# Patient Record
Sex: Male | Born: 1950 | Race: White | Hispanic: No | Marital: Married | Smoking: Never smoker
Health system: Southern US, Community
[De-identification: ages and names within clinical notes are randomized; demographics above are authoritative.]

## PROBLEM LIST (undated history)

## (undated) DIAGNOSIS — Z972 Presence of dental prosthetic device (complete) (partial): Secondary | ICD-10-CM

## (undated) DIAGNOSIS — T8859XA Other complications of anesthesia, initial encounter: Secondary | ICD-10-CM

## (undated) DIAGNOSIS — K8020 Calculus of gallbladder without cholecystitis without obstruction: Secondary | ICD-10-CM

## (undated) DIAGNOSIS — M1990 Unspecified osteoarthritis, unspecified site: Secondary | ICD-10-CM

## (undated) DIAGNOSIS — T4145XA Adverse effect of unspecified anesthetic, initial encounter: Secondary | ICD-10-CM

## (undated) DIAGNOSIS — K8050 Calculus of bile duct without cholangitis or cholecystitis without obstruction: Secondary | ICD-10-CM

## (undated) DIAGNOSIS — Z8619 Personal history of other infectious and parasitic diseases: Secondary | ICD-10-CM

## (undated) DIAGNOSIS — K219 Gastro-esophageal reflux disease without esophagitis: Secondary | ICD-10-CM

## (undated) HISTORY — DX: Calculus of gallbladder without cholecystitis without obstruction: K80.20

## (undated) HISTORY — DX: Calculus of bile duct without cholangitis or cholecystitis without obstruction: K80.50

## (undated) HISTORY — PX: COLONOSCOPY: SHX174

## (undated) HISTORY — DX: Personal history of other infectious and parasitic diseases: Z86.19

## (undated) HISTORY — PX: LAPAROSCOPIC PARTIAL COLECTOMY: SHX5907

---

## 2007-01-29 ENCOUNTER — Ambulatory Visit: Payer: Self-pay | Admitting: Gastroenterology

## 2007-11-07 DIAGNOSIS — M129 Arthropathy, unspecified: Secondary | ICD-10-CM | POA: Insufficient documentation

## 2013-07-18 LAB — BASIC METABOLIC PANEL
BUN: 12 mg/dL (ref 4–21)
Creatinine: 0.9 mg/dL (ref 0.6–1.3)
GLUCOSE: 95 mg/dL
Potassium: 5 mmol/L (ref 3.4–5.3)
Sodium: 143 mmol/L (ref 137–147)

## 2013-07-18 LAB — LIPID PANEL
CHOLESTEROL: 175 mg/dL (ref 0–200)
HDL: 55 mg/dL (ref 35–70)
LDL CALC: 102 mg/dL
Triglycerides: 92 mg/dL (ref 40–160)

## 2013-07-18 LAB — PSA: PSA: 0.5

## 2013-07-18 LAB — TSH: TSH: 0.95 u[IU]/mL (ref 0.41–5.90)

## 2015-07-09 ENCOUNTER — Ambulatory Visit: Payer: Self-pay | Admitting: Physician Assistant

## 2015-07-09 DIAGNOSIS — Z299 Encounter for prophylactic measures, unspecified: Secondary | ICD-10-CM

## 2015-07-09 NOTE — Progress Notes (Signed)
Patient ID: Andrew Villanueva, male   DOB: [birth year], 64 y.o.   MRN: [id number] Patient came in to have flu shot done here in the clinic.  Patient stayed for 10 minutes and had no adverse reaction.

## 2015-11-02 DIAGNOSIS — E669 Obesity, unspecified: Secondary | ICD-10-CM | POA: Insufficient documentation

## 2015-11-02 DIAGNOSIS — R030 Elevated blood-pressure reading, without diagnosis of hypertension: Secondary | ICD-10-CM

## 2015-11-02 DIAGNOSIS — IMO0001 Reserved for inherently not codable concepts without codable children: Secondary | ICD-10-CM | POA: Insufficient documentation

## 2015-11-04 ENCOUNTER — Ambulatory Visit (INDEPENDENT_AMBULATORY_CARE_PROVIDER_SITE_OTHER): Payer: Managed Care, Other (non HMO) | Admitting: Family Medicine

## 2015-11-04 ENCOUNTER — Encounter: Payer: Self-pay | Admitting: Family Medicine

## 2015-11-04 VITALS — BP 140/80 | HR 56 | Temp 98.0°F | Resp 16 | Ht 73.0 in | Wt 232.0 lb

## 2015-11-04 DIAGNOSIS — Z0000 Encounter for general adult medical examination without abnormal findings: Secondary | ICD-10-CM | POA: Diagnosis not present

## 2015-11-04 NOTE — Patient Instructions (Signed)
   It is recommended to engage in 150 minutes of vigorous exercise every week.    Recommend taking 81mg  enteric coated aspirin to reduce risk of vascular events such as heart attacks and strokes.

## 2015-11-04 NOTE — Progress Notes (Signed)
Patient: Andrew Villanueva, Male    DOB: 1950-09-20, 65 y.o.   MRN: [id number] Visit Date: 11/04/2015  Today's Provider: Lelon Huh, MD   Chief Complaint  Patient presents with  . Annual Exam  . Blood Pressure Check    follow up   Subjective:    Annual physical exam Andrew Villanueva is a 65 y.o. male who presents today for health maintenance and complete physical. He feels well. He reports never exercising . He reports he is sleeping fairly well.  -----------------------------------------------------------------  Elevated blood pressure, follow-up:  BP Readings from Last 3 Encounters:  07/18/13 148/80    He was last seen for hypertension 3 years ago.  BP at that visit was 148/80. Management since that visit includes recommending patient start DASH diet, increase exercise and loose weight. He reports fair compliance with treatment. Has not been exercising.  He is not having side effects.  He is not exercising. He is not adherent to low salt diet.   Outside blood pressures are checked at the pharmacy occasionally; patient reports it is usually borderline high. He is experiencing chest pain.  Patient denies chest pressure/discomfort, claudication, dyspnea, exertional chest pressure/discomfort, irregular heart beat, lower extremity edema, near-syncope, orthopnea, palpitations, paroxysmal nocturnal dyspnea, syncope and tachypnea.   Cardiovascular risk factors include advanced age (older than 51 for men, 33 for women) and male gender.  Use of agents associated with hypertension: none.     Weight trend: stable Wt Readings from Last 3 Encounters:  07/18/13 235 lb (106.595 kg)    Current diet: in general, a "healthy" diet    ------------------------------------------------------------------------    Review of Systems  Constitutional: Positive for fatigue. Negative for fever, chills and appetite change.  HENT: Negative for congestion, ear pain, hearing loss,  nosebleeds and trouble swallowing.   Eyes: Negative for pain and visual disturbance.  Respiratory: Negative for cough, chest tightness and shortness of breath.   Cardiovascular: Positive for chest pain (unchanged from baseline ). Negative for palpitations and leg swelling.  Gastrointestinal: Negative for nausea, vomiting, abdominal pain, diarrhea, constipation and blood in stool.  Endocrine: Negative for polydipsia, polyphagia and polyuria.  Genitourinary: Negative for dysuria and flank pain.  Musculoskeletal: Negative for myalgias, back pain, joint swelling, arthralgias and neck stiffness.  Skin: Negative for color change, rash and wound.  Neurological: Negative for dizziness, tremors, seizures, speech difficulty, weakness, light-headedness and headaches.  Psychiatric/Behavioral: Negative for behavioral problems, confusion, sleep disturbance, dysphoric mood and decreased concentration. The patient is not nervous/anxious.   All other systems reviewed and are negative.   Social History      He  reports that he has been smoking.  He does not have any smokeless tobacco history on file. He reports that he drinks alcohol. He reports that he does not use illicit drugs.       Social History   Social History  . Marital Status: Married    Spouse Name: N/A  . Number of Children: 2  . Years of Education: N/A   Occupational History  . Associate Professor    Social History Main Topics  . Smoking status: Never Smoker   . Smokeless tobacco: None  . Alcohol Use: 0.0 oz/week    0 Standard drinks or equivalent per week     Comment: minimal alcohol consumption  . Drug Use: No  . Sexual Activity: Not Asked   Other Topics Concern  . None   Social History Narrative  Past Medical History  Diagnosis Date  . History of measles   . Arthropathy   . Elevated blood pressure   . Obesity      Patient Active Problem List   Diagnosis Date Noted  . Obesity 11/02/2015  . Elevated blood pressure  11/02/2015  . 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 Family history of cardiovascular disease 10/25/2006    No past surgical history on file.  Family History        Family Status  Relation Status Death Age  . Mother Deceased 81  . Father Deceased 57    natural causes  . Brother Alive   . Daughter Alive   . Son Alive   . Brother Alive         His family history includes CAD in his father; Diabetes in his mother; Heart attack in his mother. There is no history of Colon cancer or Prostate cancer.    No Known Allergies  Previous Medications   No medications on file    Patient Care Team: Birdie Sons, MD as PCP - General (Family Medicine)     Objective:   Vitals: BP 140/80 mmHg  Pulse 56  Temp(Src) 98 F (36.7 C) (Oral)  Resp 16  Ht 6\' 1"  (1.854 m)  Wt 232 lb (105.235 kg)  BMI 30.62 kg/m2  SpO2 97%   Physical Exam   General Appearance:    Alert, cooperative, no distress, appears stated age, overweigh  Head:    Normocephalic, without obvious abnormality, atraumatic  Eyes:    PERRL, conjunctiva/corneas clear, EOM's intact, fundi    benign, both eyes       Ears:    Normal TM's and external ear canals, both ears  Nose:   Nares normal, septum midline, mucosa normal, no drainage   or sinus tenderness  Throat:   Lips, mucosa, and tongue normal; teeth and gums normal  Neck:   Supple, symmetrical, trachea midline, no adenopathy;       thyroid:  No enlargement/tenderness/nodules; no carotid   bruit or JVD  Back:     Symmetric, no curvature, ROM normal, no CVA tenderness  Lungs:     Clear to auscultation bilaterally, respirations unlabored  Chest wall:    No tenderness or deformity  Heart:    Regular rate and rhythm, S1 and S2 normal, no murmur, rub   or gallop  Abdomen:     Soft, non-tender, bowel sounds active all four quadrants,    no masses, no organomegaly  Genitalia:    deferred  Rectal:    deferred  Extremities:   Extremities normal, atraumatic, no cyanosis or edema    Pulses:   2+ and symmetric all extremities  Skin:   Skin color, texture, turgor normal, no rashes or lesions  Lymph nodes:   Cervical, supraclavicular, and axillary nodes normal  Neurologic:   CNII-XII intact. Normal strength, sensation and reflexes      throughout   Depression Screen PHQ 2/9 Scores 11/04/2015  PHQ - 2 Score 0  PHQ- 9 Score 1      Assessment & Plan:     Routine Health Maintenance and Physical Exam  Exercise Activities and Dietary recommendations Goals    None      Immunization History  Administered Date(s) Administered  . Influenza,inj,Quad PF,36+ Mos 07/09/2015  . Tdap 10/25/2006    Health Maintenance  Topic Date Due  . HIV Screening  07/18/1966  . COLONOSCOPY  07/18/2001  . ZOSTAVAX  07/19/2011  .  INFLUENZA VACCINE  03/08/2016  . TETANUS/TDAP  10/24/2016  . Hepatitis C Screening  Completed      Discussed health benefits of physical activity, and encouraged him to engage in regular exercise appropriate for his age and condition.    -------------------------------------------------------------------- 1. Annual physical exam Generally doing well.  - EKG 12-Lead - Comprehensive metabolic panel - Lipid panel - PSA

## 2015-11-05 ENCOUNTER — Telehealth: Payer: Self-pay

## 2015-11-05 LAB — COMPREHENSIVE METABOLIC PANEL
ALBUMIN: 4.2 g/dL (ref 3.6–4.8)
ALT: 39 IU/L (ref 0–44)
AST: 27 IU/L (ref 0–40)
Albumin/Globulin Ratio: 1.4 (ref 1.2–2.2)
Alkaline Phosphatase: 76 IU/L (ref 39–117)
BILIRUBIN TOTAL: 1.4 mg/dL — AB (ref 0.0–1.2)
BUN/Creatinine Ratio: 14 (ref 10–22)
BUN: 13 mg/dL (ref 8–27)
CALCIUM: 9.6 mg/dL (ref 8.6–10.2)
CO2: 27 mmol/L (ref 18–29)
CREATININE: 0.94 mg/dL (ref 0.76–1.27)
Chloride: 102 mmol/L (ref 96–106)
GFR calc Af Amer: 99 mL/min/{1.73_m2} (ref >59)
GFR, EST NON AFRICAN AMERICAN: 85 mL/min/{1.73_m2} (ref >59)
GLUCOSE: 104 mg/dL — AB (ref 65–99)
Globulin, Total: 3 g/dL (ref 1.5–4.5)
Potassium: 5 mmol/L (ref 3.5–5.2)
SODIUM: 142 mmol/L (ref 134–144)
TOTAL PROTEIN: 7.2 g/dL (ref 6.0–8.5)

## 2015-11-05 LAB — PSA: Prostate Specific Ag, Serum: 0.8 ng/mL (ref 0.0–4.0)

## 2015-11-05 LAB — LIPID PANEL
CHOL/HDL RATIO: 3 ratio (ref 0.0–5.0)
Cholesterol, Total: 160 mg/dL (ref 100–199)
HDL: 53 mg/dL (ref >39)
LDL CALC: 91 mg/dL (ref 0–99)
TRIGLYCERIDES: 82 mg/dL (ref 0–149)
VLDL CHOLESTEROL CAL: 16 mg/dL (ref 5–40)

## 2015-11-05 NOTE — Telephone Encounter (Signed)
Informed pt's wife as below. Beckam Abdulaziz Drozdowski, CMA  

## 2015-11-05 NOTE — Telephone Encounter (Signed)
-----   Message from Birdie Sons, MD sent at 11/05/2015  7:47 AM EDT ----- PSA, blood sugar, kidney functions, electrolytes and cholesterol are all normal. Check labs yearly.

## 2015-11-09 ENCOUNTER — Encounter: Payer: Self-pay | Admitting: Family Medicine

## 2016-11-15 ENCOUNTER — Ambulatory Visit: Payer: Self-pay | Admitting: Physician Assistant

## 2016-11-15 VITALS — BP 130/80 | HR 62 | Temp 97.9°F | Ht 73.0 in | Wt 232.0 lb

## 2016-11-15 DIAGNOSIS — Z008 Encounter for other general examination: Secondary | ICD-10-CM

## 2016-11-15 DIAGNOSIS — Z0189 Encounter for other specified special examinations: Principal | ICD-10-CM

## 2016-11-15 NOTE — Progress Notes (Signed)
Pt here for biometric, measurements performed by RMA, pt states he had a physical with his doctor

## 2016-11-16 ENCOUNTER — Encounter: Payer: Self-pay | Admitting: Physician Assistant

## 2016-11-21 ENCOUNTER — Other Ambulatory Visit: Payer: Self-pay

## 2016-11-21 ENCOUNTER — Encounter: Payer: Self-pay | Admitting: Family Medicine

## 2016-11-21 ENCOUNTER — Ambulatory Visit (INDEPENDENT_AMBULATORY_CARE_PROVIDER_SITE_OTHER): Payer: Managed Care, Other (non HMO) | Admitting: Family Medicine

## 2016-11-21 VITALS — BP 138/80 | HR 60 | Temp 98.1°F | Resp 16 | Ht 73.0 in | Wt 231.0 lb

## 2016-11-21 DIAGNOSIS — Z1211 Encounter for screening for malignant neoplasm of colon: Secondary | ICD-10-CM | POA: Diagnosis not present

## 2016-11-21 DIAGNOSIS — Z23 Encounter for immunization: Secondary | ICD-10-CM

## 2016-11-21 DIAGNOSIS — Z0000 Encounter for general adult medical examination without abnormal findings: Secondary | ICD-10-CM | POA: Diagnosis not present

## 2016-11-21 DIAGNOSIS — Z125 Encounter for screening for malignant neoplasm of prostate: Secondary | ICD-10-CM

## 2016-11-21 DIAGNOSIS — Z299 Encounter for prophylactic measures, unspecified: Secondary | ICD-10-CM

## 2016-11-21 NOTE — Progress Notes (Signed)
Patient: Andrew Villanueva, Male    DOB: [birth year], 66 y.o.   MRN: [id number] Visit Date: 11/21/2016  Today's Provider: Lelon Huh, MD   Chief Complaint  Patient presents with  . Annual Exam   Subjective:    Annual physical exam Andrew Villanueva is a 66 y.o. male who presents today for health maintenance and complete physical. He feels well. He reports not exercising. He reports he is sleeping well. Is still working full time.   -----------------------------------------------------------------   Review of Systems  Constitutional: Negative.   HENT: Negative.   Eyes: Negative.   Respiratory: Negative.   Cardiovascular: Negative.   Gastrointestinal: Negative.   Endocrine: Negative.   Genitourinary: Negative.   Musculoskeletal: Negative.   Skin: Negative.   Allergic/Immunologic: Negative.   Neurological: Negative.   Hematological: Negative.   Psychiatric/Behavioral: Negative.     Social History      He  reports that he has never smoked. He has never used smokeless tobacco. He reports that he drinks alcohol. He reports that he does not use drugs.       Social History   Social History  . Marital status: Married    Spouse name: N/A  . Number of children: 2  . Years of education: N/A   Occupational History  . Associate Professor    Social History Main Topics  . Smoking status: Never Smoker  . Smokeless tobacco: Never Used  . Alcohol use 0.0 oz/week     Comment: minimal alcohol consumption  . Drug use: No  . Sexual activity: Not Asked   Other Topics Concern  . None   Social History Narrative  . None    Past Medical History:  Diagnosis Date  . History of measles      Patient Active Problem List   Diagnosis Date Noted  . Obesity 11/02/2015  . Elevated blood pressure 11/02/2015  . 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 Family history of cardiovascular disease 10/25/2006    Past Surgical History:  Procedure Laterality Date  . None      Family History         Family Status  Relation Status  . Mother Deceased at age 64  . Father Deceased at age 33   natural causes  . Brother Alive  . Daughter Alive  . Son Alive  . Brother Deceased  . Neg Hx         His family history includes CAD in his father; Diabetes in his mother; Heart attack in his mother; Lung cancer in his brother.     No Known Allergies  No current outpatient prescriptions on file.   Patient Care Team: Birdie Sons, MD as PCP - General (Family Medicine)      Objective:   Vitals: BP 138/80 (BP Location: Right Arm, Patient Position: Sitting, Cuff Size: Large)   Pulse 60   Temp 98.1 F (36.7 C) (Oral)   Resp 16   Ht 6\' 1"  (1.854 m)   Wt 231 lb (104.8 kg)   BMI 30.48 kg/m    Vitals:   11/21/16 0913  BP: 138/80  Pulse: 60  Resp: 16  Temp: 98.1 F (36.7 C)  TempSrc: Oral  Weight: 231 lb (104.8 kg)  Height: 6\' 1"  (1.854 m)     Physical Exam   General Appearance:    Alert, cooperative, no distress, appears stated age  Head:    Normocephalic, without obvious abnormality, atraumatic  Eyes:  PERRL, conjunctiva/corneas clear, EOM's intact, fundi    benign, both eyes       Ears:    Normal TM's and external ear canals, both ears  Nose:   Nares normal, septum midline, mucosa normal, no drainage   or sinus tenderness  Throat:   Lips, mucosa, and tongue normal; teeth and gums normal  Neck:   Supple, symmetrical, trachea midline, no adenopathy;       thyroid:  No enlargement/tenderness/nodules; no carotid   bruit or JVD  Back:     Symmetric, no curvature, ROM normal, no CVA tenderness  Lungs:     Clear to auscultation bilaterally, respirations unlabored  Chest wall:    No tenderness or deformity  Heart:    Regular rate and rhythm, S1 and S2 normal, no murmur, rub   or gallop  Abdomen:     Soft, non-tender, bowel sounds active all four quadrants,    no masses, no organomegaly  Genitalia:    deferred  Rectal:    deferred  Extremities:   Extremities  normal, atraumatic, no cyanosis or edema  Pulses:   2+ and symmetric all extremities  Skin:   Skin color, texture, turgor normal, no rashes or lesions  Lymph nodes:   Cervical, supraclavicular, and axillary nodes normal  Neurologic:   CNII-XII intact. Normal strength, sensation and reflexes      throughout    Depression Screen PHQ 2/9 Scores 11/21/2016 11/21/2016 11/04/2015  PHQ - 2 Score 0 0 0  PHQ- 9 Score 1 1 1       Assessment & Plan:     Routine Health Maintenance and Physical Exam  Exercise Activities and Dietary recommendations Goals    None      Immunization History  Administered Date(s) Administered  . Influenza,inj,Quad PF,36+ Mos 07/09/2015  . Tdap 10/25/2006, 04/01/2015    Health Maintenance  Topic Date Due  . HIV Screening  07/18/1966  . PNA vac Low Risk Adult (1 of 2 - PCV13) 07/18/2016  . TETANUS/TDAP  10/24/2016  . COLONOSCOPY  01/28/2017  . INFLUENZA VACCINE  03/08/2017  . Hepatitis C Screening  Completed     Discussed health benefits of physical activity, and encouraged him to engage in regular exercise appropriate for his age and condition.    --------------------------------------------------------------------  1. Annual physical exam Doing very well with normal exam.  - Comprehensive metabolic panel - Lipid panel - PSA  2. Colon cancer screening  - Ambulatory referral to Gastroenterology  3. Need for pneumococcal vaccination  - Pneumococcal conjugate vaccine 13-valent IM  4. Prostate cancer screening  - PSA    Lelon Huh, MD  [facility]

## 2016-11-21 NOTE — Progress Notes (Signed)
Patient came in to have his blood drawn for testing per Dr. Elenore Rota Fisher's orders.

## 2016-11-22 LAB — CMP12+LP+TP+TSH+6AC+PSA+CBC…
ALK PHOS: 80 IU/L (ref 39–117)
ALT: 36 IU/L (ref 0–44)
AST: 22 IU/L (ref 0–40)
Albumin/Globulin Ratio: 1.7 (ref 1.2–2.2)
Albumin: 4.6 g/dL (ref 3.6–4.8)
BUN/Creatinine Ratio: 14 (ref 10–24)
BUN: 13 mg/dL (ref 8–27)
Basophils Absolute: 0 10*3/uL (ref 0.0–0.2)
Basos: 1 %
Bilirubin Total: 1 mg/dL (ref 0.0–1.2)
CALCIUM: 9.5 mg/dL (ref 8.6–10.2)
CHOL/HDL RATIO: 2.8 ratio (ref 0.0–5.0)
CREATININE: 0.94 mg/dL (ref 0.76–1.27)
Chloride: 105 mmol/L (ref 96–106)
Cholesterol, Total: 154 mg/dL (ref 100–199)
EOS (ABSOLUTE): 0.2 10*3/uL (ref 0.0–0.4)
EOS: 3 %
Free Thyroxine Index: 1.7 (ref 1.2–4.9)
GFR calc Af Amer: 98 mL/min/{1.73_m2} (ref >59)
GFR, EST NON AFRICAN AMERICAN: 85 mL/min/{1.73_m2} (ref >59)
GGT: 22 IU/L (ref 0–65)
Globulin, Total: 2.7 g/dL (ref 1.5–4.5)
Glucose: 106 mg/dL — ABNORMAL HIGH (ref 65–99)
HDL: 55 mg/dL (ref >39)
HEMATOCRIT: 48.3 % (ref 37.5–51.0)
HEMOGLOBIN: 16.5 g/dL (ref 13.0–17.7)
IMMATURE GRANS (ABS): 0 10*3/uL (ref 0.0–0.1)
IMMATURE GRANULOCYTES: 0 %
Iron: 112 ug/dL (ref 38–169)
LDH: 196 IU/L (ref 121–224)
LDL CALC: 82 mg/dL (ref 0–99)
LYMPHS ABS: 1.9 10*3/uL (ref 0.7–3.1)
Lymphs: 31 %
MCH: 32.6 pg (ref 26.6–33.0)
MCHC: 34.2 g/dL (ref 31.5–35.7)
MCV: 96 fL (ref 79–97)
Monocytes Absolute: 0.4 10*3/uL (ref 0.1–0.9)
Monocytes: 7 %
NEUTROS PCT: 58 %
Neutrophils Absolute: 3.6 10*3/uL (ref 1.4–7.0)
PHOSPHORUS: 3.5 mg/dL (ref 2.5–4.5)
POTASSIUM: 5.5 mmol/L — AB (ref 3.5–5.2)
PROSTATE SPECIFIC AG, SERUM: 0.8 ng/mL (ref 0.0–4.0)
Platelets: 236 10*3/uL (ref 150–379)
RBC: 5.06 x10E6/uL (ref 4.14–5.80)
RDW: 13.3 % (ref 12.3–15.4)
Sodium: 144 mmol/L (ref 134–144)
T3 Uptake Ratio: 24 % (ref 24–39)
T4 TOTAL: 7.1 ug/dL (ref 4.5–12.0)
TSH: 1.48 u[IU]/mL (ref 0.450–4.500)
Total Protein: 7.3 g/dL (ref 6.0–8.5)
Triglycerides: 83 mg/dL (ref 0–149)
URIC ACID: 8 mg/dL (ref 3.7–8.6)
VLDL Cholesterol Cal: 17 mg/dL (ref 5–40)
WBC: 6.2 10*3/uL (ref 3.4–10.8)

## 2016-11-30 ENCOUNTER — Telehealth: Payer: Self-pay

## 2016-11-30 ENCOUNTER — Other Ambulatory Visit: Payer: Self-pay

## 2016-11-30 DIAGNOSIS — Z1211 Encounter for screening for malignant neoplasm of colon: Secondary | ICD-10-CM

## 2016-11-30 NOTE — Telephone Encounter (Signed)
Gastroenterology Pre-Procedure Review  Request Date:  Requesting Physician: Dr.   PATIENT REVIEW QUESTIONS: The patient responded to the following health history questions as indicated:    1. Are you having any GI issues? no 2. Do you have a personal history of Polyps? no 3. Do you have a family history of Colon Cancer or Polyps? no 4. Diabetes Mellitus? no 5. Joint replacements in the past 12 months?no 6. Major health problems in the past 3 months?no 7. Any artificial heart valves, MVP, or defibrillator?no    MEDICATIONS & ALLERGIES:    Patient reports the following regarding taking any anticoagulation/antiplatelet therapy:   Plavix, Coumadin, Eliquis, Xarelto, Lovenox, Pradaxa, Brilinta, or Effient? no Aspirin? no  Patient confirms/reports the following medications:  No current outpatient prescriptions on file.   No current facility-administered medications for this visit.     Patient confirms/reports the following allergies:  No Known Allergies  No orders of the defined types were placed in this encounter.   AUTHORIZATION INFORMATION Primary Insurance: 1D#: Group #:  Secondary Insurance: 1D#: Group #:  SCHEDULE INFORMATION: Date: 01/23/17 Time: Location: [place]

## 2016-12-01 ENCOUNTER — Telehealth: Payer: Self-pay | Admitting: Family Medicine

## 2016-12-01 NOTE — Telephone Encounter (Signed)
Pt needs actual numbers of lab results for his job assessment.  His call back is [phone]  Thanks Con Memos

## 2016-12-01 NOTE — Telephone Encounter (Signed)
Patient advised copy of lab results is at front desk for pickup.

## 2017-01-17 ENCOUNTER — Encounter: Payer: Self-pay | Admitting: *Deleted

## 2017-01-20 ENCOUNTER — Other Ambulatory Visit: Payer: Self-pay

## 2017-01-20 MED ORDER — PEG 3350-KCL-NA BICARB-NACL 420 G PO SOLR: 4000.0000 mL | Freq: Once | ORAL | 0 refills | Status: AC

## 2017-01-20 NOTE — Discharge Instructions (Signed)
General Anesthesia, Adult, Care After °These instructions provide you with information about caring for yourself after your procedure. Your health care provider may also give you more specific instructions. Your treatment has been planned according to current medical practices, but problems sometimes occur. Call your health care provider if you have any problems or questions after your procedure. °What can I expect after the procedure? °After the procedure, it is common to have: °· Vomiting. °· A sore throat. °· Mental slowness. ° °It is common to feel: °· Nauseous. °· Cold or shivery. °· Sleepy. °· Tired. °· Sore or achy, even in parts of your body where you did not have surgery. ° °Follow these instructions at home: °For at least 24 hours after the procedure: °· Do not: °? Participate in activities where you could fall or become injured. °? Drive. °? Use heavy machinery. °? Drink alcohol. °? Take sleeping pills or medicines that cause drowsiness. °? Make important decisions or sign legal documents. °? Take care of children on your own. °· Rest. °Eating and drinking °· If you vomit, drink water, juice, or soup when you can drink without vomiting. °· Drink enough fluid to keep your urine clear or pale yellow. °· Make sure you have little or no nausea before eating solid foods. °· Follow the diet recommended by your health care provider. °General instructions °· Have a responsible adult stay with you until you are awake and alert. °· Return to your normal activities as told by your health care provider. Ask your health care provider what activities are safe for you. °· Take over-the-counter and prescription medicines only as told by your health care provider. °· If you smoke, do not smoke without supervision. °· Keep all follow-up visits as told by your health care provider. This is important. °Contact a health care provider if: °· You continue to have nausea or vomiting at home, and medicines are not helpful. °· You  cannot drink fluids or start eating again. °· You cannot urinate after 8-12 hours. °· You develop a skin rash. °· You have fever. °· You have increasing redness at the site of your procedure. °Get help right away if: °· You have difficulty breathing. °· You have chest pain. °· You have unexpected bleeding. °· You feel that you are having a life-threatening or urgent problem. °This information is not intended to replace advice given to you by your health care provider. Make sure you discuss any questions you have with your health care provider. °Document Released: 10/31/2000 Document Revised: 12/28/2015 Document Reviewed: 07/09/2015 °Elsevier Interactive Patient Education © 2018 [employer] ° °

## 2017-01-23 ENCOUNTER — Ambulatory Visit
Admission: RE | Admit: 2017-01-23 | Discharge: 2017-01-23 | Disposition: A | Discharge status: Home / Self Care | Payer: Managed Care, Other (non HMO) | Source: Ambulatory Visit | Attending: Gastroenterology | Admitting: Gastroenterology

## 2017-01-23 ENCOUNTER — Ambulatory Visit: Payer: Managed Care, Other (non HMO) | Admitting: Anesthesiology

## 2017-01-23 ENCOUNTER — Encounter
Admission: RE | Disposition: A | Discharge status: Home / Self Care | Payer: Self-pay | Source: Ambulatory Visit | Attending: Gastroenterology

## 2017-01-23 DIAGNOSIS — D122 Benign neoplasm of ascending colon: Secondary | ICD-10-CM | POA: Diagnosis not present

## 2017-01-23 DIAGNOSIS — Z1211 Encounter for screening for malignant neoplasm of colon: Secondary | ICD-10-CM | POA: Diagnosis not present

## 2017-01-23 DIAGNOSIS — K5730 Diverticulosis of large intestine without perforation or abscess without bleeding: Secondary | ICD-10-CM | POA: Insufficient documentation

## 2017-01-23 DIAGNOSIS — D124 Benign neoplasm of descending colon: Secondary | ICD-10-CM | POA: Diagnosis not present

## 2017-01-23 DIAGNOSIS — D123 Benign neoplasm of transverse colon: Secondary | ICD-10-CM

## 2017-01-23 DIAGNOSIS — Z8249 Family history of ischemic heart disease and other diseases of the circulatory system: Secondary | ICD-10-CM | POA: Diagnosis not present

## 2017-01-23 DIAGNOSIS — K641 Second degree hemorrhoids: Secondary | ICD-10-CM | POA: Insufficient documentation

## 2017-01-23 HISTORY — PX: COLONOSCOPY WITH PROPOFOL: SHX5780

## 2017-01-23 HISTORY — PX: POLYPECTOMY: SHX5525

## 2017-01-23 HISTORY — DX: Presence of dental prosthetic device (complete) (partial): Z97.2

## 2017-01-23 SURGERY — COLONOSCOPY WITH PROPOFOL
Anesthesia: General | Wound class: Contaminated

## 2017-01-23 MED ORDER — OXYCODONE HCL 5 MG/5ML PO SOLN: 5.0000 mg | Freq: Once | ORAL | Status: DC | PRN

## 2017-01-23 MED ORDER — LIDOCAINE HCL (CARDIAC) 20 MG/ML IV SOLN
INTRAVENOUS | Status: DC | PRN
  Administered 2017-01-23: 50 mg via INTRAVENOUS

## 2017-01-23 MED ORDER — OXYCODONE HCL 5 MG PO TABS: 5.0000 mg | ORAL_TABLET | Freq: Once | ORAL | Status: DC | PRN

## 2017-01-23 MED ORDER — LACTATED RINGERS IV SOLN: INTRAVENOUS | Status: DC

## 2017-01-23 MED ORDER — LACTATED RINGERS IV SOLN
INTRAVENOUS | Status: DC | PRN
  Administered 2017-01-23: 09:00:00 via INTRAVENOUS

## 2017-01-23 MED ORDER — STERILE WATER FOR IRRIGATION IR SOLN
Status: DC | PRN
  Administered 2017-01-23: 09:00:00

## 2017-01-23 MED ORDER — PROPOFOL 10 MG/ML IV BOLUS
INTRAVENOUS | Status: DC | PRN
  Administered 2017-01-23: 50 mg via INTRAVENOUS
  Administered 2017-01-23: 100 mg via INTRAVENOUS
  Administered 2017-01-23: 50 mg via INTRAVENOUS
  Administered 2017-01-23: 20 mg via INTRAVENOUS

## 2017-01-23 SURGICAL SUPPLY — 23 items

## 2017-01-23 NOTE — Anesthesia Preprocedure Evaluation (Signed)
Anesthesia Evaluation  Patient identified by MRN, date of birth, ID band Patient awake    Reviewed: Allergy & Precautions, H&P , NPO status , Patient's Chart, lab work & pertinent test results  Airway Mallampati: I  TM Distance: >3 FB Neck ROM: full    Dental  (+) Edentulous Upper, Edentulous Lower   Pulmonary neg pulmonary ROS,    Pulmonary exam normal        Cardiovascular negative cardio ROS Normal cardiovascular exam     Neuro/Psych    GI/Hepatic negative GI ROS, Neg liver ROS,   Endo/Other  negative endocrine ROS  Renal/GU negative Renal ROS     Musculoskeletal   Abdominal   Peds  Hematology negative hematology ROS (+)   Anesthesia Other Findings   Reproductive/Obstetrics negative OB ROS                             Anesthesia Physical Anesthesia Plan  ASA: II  Anesthesia Plan: General   Post-op Pain Management:    Induction:   PONV Risk Score and Plan:   Airway Management Planned:   Additional Equipment:   Intra-op Plan:   Post-operative Plan:   Informed Consent:   Plan Discussed with:   Anesthesia Plan Comments:         Anesthesia Quick Evaluation

## 2017-01-23 NOTE — Anesthesia Procedure Notes (Signed)
Performed by: Klinton Candelas Pre-anesthesia Checklist: Patient identified, Emergency Drugs available, Suction available, Timeout performed and Patient being monitored Patient Re-evaluated:Patient Re-evaluated prior to induction Oxygen Delivery Method: Nasal cannula Placement Confirmation: positive ETCO2       

## 2017-01-23 NOTE — H&P (Signed)
   Lucilla Lame, MD [facility] [address], [place], [place] [address] Phone: [phone] Fax : [phone]  Primary Care Physician:  Birdie Sons, MD Primary Gastroenterologist:  Dr. Allen Norris  Pre-Procedure History & Physical: HPI:  Andrew Villanueva is a 66 y.o. male is here for a screening colonoscopy.   Past Medical History:  Diagnosis Date  . History of measles   . Wears dentures    upper partial    Past Surgical History:  Procedure Laterality Date  . COLONOSCOPY      Prior to Admission medications   Not on File    Allergies as of 11/30/2016  . (No Known Allergies)    Family History  Problem Relation Age of Onset  . Diabetes Mother   . Heart attack Mother   . CAD Father   . Diabetes Father   . Lung cancer Brother   . Colon cancer Neg Hx   . Prostate cancer Neg Hx     Social History   Social History  . Marital status: Married    Spouse name: N/A  . Number of children: 2  . Years of education: N/A   Occupational History  . Associate Professor    Social History Main Topics  . Smoking status: Never Smoker  . Smokeless tobacco: Never Used  . Alcohol use 1.2 oz/week    2 Cans of beer per week     Comment: minimal alcohol consumption  . Drug use: No  . Sexual activity: Not on file   Other Topics Concern  . Not on file   Social History Narrative  . No narrative on file    Review of Systems: See HPI, otherwise negative ROS  Physical Exam: BP (!) 152/94   Pulse (!) 55   Ht 6\' 1"  (1.854 m)   Wt 220 lb (99.8 kg)   SpO2 99%   BMI 29.03 kg/m  General:   Alert,  pleasant and cooperative in NAD Head:  Normocephalic and atraumatic. Neck:  Supple; no masses or thyromegaly. Lungs:  Clear throughout to auscultation.    Heart:  Regular rate and rhythm. Abdomen:  Soft, nontender and nondistended. Normal bowel sounds, without guarding, and without rebound.   Neurologic:  Alert and  oriented x4;  grossly normal  neurologically.  Impression/Plan: Andrew Villanueva is now here to undergo a screening colonoscopy.  Risks, benefits, and alternatives regarding colonoscopy have been reviewed with the patient.  Questions have been answered.  All parties agreeable.

## 2017-01-23 NOTE — Op Note (Signed)
[facility] Gastroenterology Patient Name: Andrew Villanueva Procedure Date: 01/23/2017 9:02 AM MRN: [id number] Account #: [id number] Date of Birth: 01-Jan-1951 Admit Type: Outpatient Age: 66 Room: [facility] OR ROOM 01 Gender: Male Note Status: Finalized Procedure:            Colonoscopy Indications:          Screening for colorectal malignant neoplasm Providers:            Lucilla Lame MD, MD Referring MD:         Kirstie Peri. Caryn Section, MD (Referring MD) Medicines:            Propofol per Anesthesia Complications:        No immediate complications. Procedure:            Pre-Anesthesia Assessment:                       - Prior to the procedure, a History and Physical was                        performed, and patient medications and allergies were                        reviewed. The patient's tolerance of previous                        anesthesia was also reviewed. The risks and benefits of                        the procedure and the sedation options and risks were                        discussed with the patient. All questions were                        answered, and informed consent was obtained. Prior                        Anticoagulants: The patient has taken no previous                        anticoagulant or antiplatelet agents. ASA Grade                        Assessment: II - A patient with mild systemic disease.                        After reviewing the risks and benefits, the patient was                        deemed in satisfactory condition to undergo the                        procedure.                       After obtaining informed consent, the colonoscope was                        passed under direct vision. Throughout the procedure,  the patient's blood pressure, pulse, and oxygen                        saturations were monitored continuously. The Olympus CF                        H180AL Colonoscope (S#: [id number]) was introduced  through                        the anus and advanced to the the cecum, identified by                        appendiceal orifice and ileocecal valve. The                        colonoscopy was performed without difficulty. The                        patient tolerated the procedure well. The quality of                        the bowel preparation was good. Findings:      The perianal and digital rectal examinations were normal.      A 3 mm polyp was found in the ascending colon. The polyp was sessile.       The polyp was removed with a cold snare. Resection and retrieval were       complete.      A 4 mm polyp was found in the transverse colon. The polyp was sessile.       The polyp was removed with a cold snare. Resection and retrieval were       complete.      Two sessile polyps were found in the descending colon. The polyps were 3       to 4 mm in size. These polyps were removed with a cold snare. Resection       and retrieval were complete.      Multiple small-mouthed diverticula were found in the sigmoid colon and       descending colon.      Non-bleeding internal hemorrhoids were found during retroflexion. The       hemorrhoids were Grade II (internal hemorrhoids that prolapse but reduce       spontaneously). Impression:           - One 3 mm polyp in the ascending colon, removed with a                        cold snare. Resected and retrieved.                       - One 4 mm polyp in the transverse colon, removed with                        a cold snare. Resected and retrieved.                       - Two 3 to 4 mm polyps in the descending colon, removed                        with a cold  snare. Resected and retrieved.                       - Diverticulosis in the sigmoid colon and in the                        descending colon.                       - Non-bleeding internal hemorrhoids. Recommendation:       - Discharge patient to home.                       - Resume previous  diet.                       - Continue present medications.                       - Await pathology results.                       - Repeat colonoscopy in 5 years if polyp adenoma and 10                        years if hyperplastic Procedure Code(s):    --- Professional ---                       [phone], Colonoscopy, flexible; with removal of tumor(s),                        polyp(s), or other lesion(s) by snare technique Diagnosis Code(s):    --- Professional ---                       Z12.11, Encounter for screening for malignant neoplasm                        of colon                       D12.2, Benign neoplasm of ascending colon                       D12.3, Benign neoplasm of transverse colon (hepatic                        flexure or splenic flexure)                       D12.4, Benign neoplasm of descending colon CPT copyright 2016 American Medical Association. All rights reserved. The codes documented in this report are preliminary and upon coder review may  be revised to meet current compliance requirements. Lucilla Lame MD, MD 01/23/2017 9:29:32 AM This report has been signed electronically. Number of Addenda: 0 Note Initiated On: 01/23/2017 9:02 AM Scope Withdrawal Time: 0 hours 11 minutes 46 seconds  Total Procedure Duration: 0 hours 14 minutes 51 seconds       [facility]

## 2017-01-23 NOTE — Transfer of Care (Signed)
Immediate Anesthesia Transfer of Care Note  Patient: Andrew Villanueva  Procedure(s) Performed: Procedure(s): COLONOSCOPY WITH PROPOFOL (N/A) POLYPECTOMY  Patient Location: PACU  Anesthesia Type: General  Level of Consciousness: awake, alert  and patient cooperative  Airway and Oxygen Therapy: Patient Spontanous Breathing and Patient connected to supplemental oxygen  Post-op Assessment: Post-op Vital signs reviewed, Patient's Cardiovascular Status Stable, Respiratory Function Stable, Patent Airway and No signs of Nausea or vomiting  Post-op Vital Signs: Reviewed and stable  Complications: No apparent anesthesia complications

## 2017-01-23 NOTE — Anesthesia Postprocedure Evaluation (Signed)
Anesthesia Post Note  Patient: Andrew Villanueva  Procedure(s) Performed: Procedure(s) (LRB): COLONOSCOPY WITH PROPOFOL (N/A) POLYPECTOMY  Patient location during evaluation: PACU Anesthesia Type: General Level of consciousness: awake and alert Pain management: pain level controlled Vital Signs Assessment: post-procedure vital signs reviewed and stable Respiratory status: spontaneous breathing Cardiovascular status: blood pressure returned to baseline Postop Assessment: no headache Anesthetic complications: no    Jaci Standard, III,  Asencion Guisinger D

## 2017-01-24 ENCOUNTER — Encounter: Payer: Self-pay | Admitting: Gastroenterology

## 2017-01-25 ENCOUNTER — Encounter: Payer: Self-pay | Admitting: Gastroenterology

## 2017-01-26 ENCOUNTER — Encounter: Payer: Self-pay | Admitting: Gastroenterology

## 2017-11-24 ENCOUNTER — Encounter: Payer: Managed Care, Other (non HMO) | Admitting: Family Medicine

## 2017-12-04 ENCOUNTER — Ambulatory Visit (INDEPENDENT_AMBULATORY_CARE_PROVIDER_SITE_OTHER): Payer: Managed Care, Other (non HMO) | Admitting: Family Medicine

## 2017-12-04 ENCOUNTER — Encounter: Payer: Self-pay | Admitting: Family Medicine

## 2017-12-04 ENCOUNTER — Other Ambulatory Visit: Payer: Self-pay

## 2017-12-04 VITALS — BP 134/84 | Temp 97.7°F | Resp 16 | Ht 73.0 in | Wt 229.0 lb

## 2017-12-04 DIAGNOSIS — Z0000 Encounter for general adult medical examination without abnormal findings: Secondary | ICD-10-CM | POA: Diagnosis not present

## 2017-12-04 DIAGNOSIS — Z23 Encounter for immunization: Secondary | ICD-10-CM | POA: Diagnosis not present

## 2017-12-04 DIAGNOSIS — Z125 Encounter for screening for malignant neoplasm of prostate: Secondary | ICD-10-CM

## 2017-12-04 DIAGNOSIS — Z008 Encounter for other general examination: Secondary | ICD-10-CM

## 2017-12-04 DIAGNOSIS — Z0189 Encounter for other specified special examinations: Principal | ICD-10-CM

## 2017-12-04 LAB — GLUCOSE, POCT (MANUAL RESULT ENTRY): POC Glucose: 117 mg/dl — AB (ref 70–99)

## 2017-12-04 NOTE — Progress Notes (Signed)
Patient: Andrew Villanueva, Male    DOB: Nov 20, 1950, 67 y.o.   MRN: [id number] Visit Date: 12/04/2017  Today's Provider: Lelon Huh, MD   Chief Complaint  Patient presents with  . Annual Exam  . Hypertension   Subjective:    Annual physical exam Andrew Villanueva is a 67 y.o. male who presents today for health maintenance and complete physical. He feels well. He reports exercising some/yard work. He reports he is sleeping well. He is still working for [employer] full time. No complaints today. Stays physically active with work and taking care of home.   ----------------------------------------------------------------   Review of Systems  Constitutional: Negative for chills, diaphoresis and fever.  HENT: Negative for congestion, ear discharge, ear pain, hearing loss, nosebleeds, sore throat and tinnitus.   Eyes: Negative for photophobia, pain, discharge and redness.  Respiratory: Negative for cough, shortness of breath, wheezing and stridor.   Cardiovascular: Negative for chest pain, palpitations and leg swelling.  Gastrointestinal: Negative for abdominal pain, blood in stool, constipation, diarrhea, nausea and vomiting.  Endocrine: Negative for polydipsia.  Genitourinary: Negative for dysuria, flank pain, frequency, hematuria and urgency.  Musculoskeletal: Positive for back pain. Negative for myalgias and neck pain.  Skin: Negative for rash.  Allergic/Immunologic: Negative for environmental allergies.  Neurological: Negative for dizziness, tremors, seizures, weakness and headaches.  Hematological: Does not bruise/bleed easily.  Psychiatric/Behavioral: Negative for hallucinations and suicidal ideas. The patient is not nervous/anxious.   All other systems reviewed and are negative.   Social History      He  reports that he has never smoked. He has never used smokeless tobacco. He reports that he drinks about 1.2 oz of alcohol per week. He reports that he does not use  drugs.       Social History   Socioeconomic History  . Marital status: Married    Spouse name: Not on file  . Number of children: 2  . Years of education: Not on file  . Highest education level: Not on file  Occupational History  . Occupation: Associate Professor  Social Needs  . Financial resource strain: Not on file  . Food insecurity:    Worry: Not on file    Inability: Not on file  . Transportation needs:    Medical: Not on file    Non-medical: Not on file  Tobacco Use  . Smoking status: Never Smoker  . Smokeless tobacco: Never Used  Substance and Sexual Activity  . Alcohol use: Yes    Alcohol/week: 1.2 oz    Types: 2 Cans of beer per week    Comment: minimal alcohol consumption  . Drug use: No  . Sexual activity: Not on file  Lifestyle  . Physical activity:    Days per week: Not on file    Minutes per session: Not on file  . Stress: Not on file  Relationships  . Social connections:    Talks on phone: Not on file    Gets together: Not on file    Attends religious service: Not on file    Active member of club or organization: Not on file    Attends meetings of clubs or organizations: Not on file    Relationship status: Not on file  Other Topics Concern  . Not on file  Social History Narrative  . Not on file    Past Medical History:  Diagnosis Date  . History of measles   . Wears dentures  upper partial     Patient Active Problem List   Diagnosis Date Noted  . Special screening for malignant neoplasms, colon   . Benign neoplasm of ascending colon   . Benign neoplasm of transverse colon   . Benign neoplasm of descending colon   . Obesity 11/02/2015  . Elevated blood pressure 11/02/2015  . 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 Family history of cardiovascular disease 10/25/2006    Past Surgical History:  Procedure Laterality Date  . COLONOSCOPY    . COLONOSCOPY WITH PROPOFOL N/A 01/23/2017   Procedure: COLONOSCOPY WITH PROPOFOL;  Surgeon: Lucilla Lame, MD;   Location: [place];  Service: Endoscopy;  Laterality: N/A;  . POLYPECTOMY  01/23/2017   Procedure: POLYPECTOMY;  Surgeon: Lucilla Lame, MD;  Location: [place];  Service: Endoscopy;;    Family History        Family Status  Relation Name Status  . Mother  Deceased at age 24  . Father  Deceased at age 62       natural causes  . Brother  Alive  . Daughter  Alive  . Son  Alive  . Brother  Deceased  . Other Half Sister Deceased  . Neg Hx  (Not Specified)        His family history includes CAD in his father; Diabetes in his father and mother; Heart attack in his mother; Lung cancer in his brother. There is no history of Colon cancer or Prostate cancer.      No Known Allergies  No current outpatient medications on file.   Patient Care Team: Birdie Sons, MD as PCP - General (Family Medicine)      Objective:   Vitals: BP 134/84 (BP Location: Right Arm, Cuff Size: Large)   Temp 97.7 F (36.5 C) (Oral)   Resp 16   Ht 6\' 1"  (1.854 m)   Wt 229 lb (103.9 kg)   BMI 30.21 kg/m    Vitals:   12/04/17 0919 12/04/17 0925  BP: (!) 154/98 134/84  Resp: 16   Temp: 97.7 F (36.5 C)   TempSrc: Oral   Weight: 229 lb (103.9 kg)   Height: 6\' 1"  (1.854 m)      Physical Exam   General Appearance:    Alert, cooperative, no distress, appears stated age, overweight  Head:    Normocephalic, without obvious abnormality, atraumatic  Eyes:    PERRL, conjunctiva/corneas clear, EOM's intact, fundi    benign, both eyes       Ears:    Normal TM's and external ear canals, both ears  Nose:   Nares normal, septum midline, mucosa normal, no drainage   or sinus tenderness  Throat:   Lips, mucosa, and tongue normal; teeth and gums normal  Neck:   Supple, symmetrical, trachea midline, no adenopathy;       thyroid:  No enlargement/tenderness/nodules; no carotid   bruit or JVD  Back:     Symmetric, no curvature, ROM normal, no CVA tenderness  Lungs:     Clear to  auscultation bilaterally, respirations unlabored  Chest wall:    No tenderness or deformity  Heart:    Regular rate and rhythm, S1 and S2 normal, no murmur, rub   or gallop  Abdomen:     Soft, non-tender, bowel sounds active all four quadrants,    no masses, no organomegaly  Genitalia:    deferred  Rectal:    deferred  Extremities:   Extremities normal, atraumatic, no cyanosis or edema  Pulses:   2+ and symmetric all extremities  Skin:   Skin color, texture, turgor normal, no rashes or lesions  Lymph nodes:   Cervical, supraclavicular, and axillary nodes normal  Neurologic:   CNII-XII intact. Normal strength, sensation and reflexes      throughout   Depression Screen PHQ 2/9 Scores 12/04/2017 11/21/2016 11/21/2016 11/04/2015  PHQ - 2 Score 0 0 0 0  PHQ- 9 Score 0 1 1 1     Cognitive Testing - 6-CIT  Correct? Score   What year is it? yes 0 0 or 4  What month is it? yes 0 0 or 3  Memorize:    Pia Mau,  42,  [place],      What time is it? (within 1 hour) yes 0 0 or 3  Count backwards from 20 yes 0 0, 2, or 4  Name the months of the year yes 0 0, 2, or 4  Repeat name & address above yes 0 0, 2, 4, 6, 8, or 10       TOTAL SCORE  0/28   Interpretation:  Normal  Normal (0-7) Abnormal (8-28)   Audit-C Alcohol Use Screening  Question Answer Points  How often do you have alcoholic drink? 1 or more times monthly 1  On days you do drink alcohol, how many drinks do you typically consume? 0 0  How oftey will you drink 6 or more in a total? never 0  Total Score:  1   A score of 3 or more in women, and 4 or more in men indicates increased risk for alcohol abuse, EXCEPT if all of the points are from question 1.    Assessment & Plan:     Routine Health Maintenance and Physical Exam  Exercise Activities and Dietary recommendations Goals    None      Immunization History  Administered Date(s) Administered  . Influenza,inj,Quad PF,6+ Mos 07/09/2015  . Pneumococcal  Conjugate-13 11/21/2016  . Tdap 10/25/2006, 04/01/2015  . Zoster 11/07/2015    Health Maintenance  Topic Date Due  . PNA vac Low Risk Adult (2 of 2 - PPSV23) 11/21/2017  . INFLUENZA VACCINE  03/08/2018  . COLONOSCOPY  01/23/2022  . TETANUS/TDAP  03/31/2025  . Hepatitis C Screening  Completed     Discussed health benefits of physical activity, and encouraged him to engage in regular exercise appropriate for his age and condition.    --------------------------------------------------------------------  1. Annual physical exam Doing well, mildly obese, otherwise normal exam. Counseled regarding Shingrix vaccine. Check lipids, check CMET at his workplace later today. Reviewed health maintenance.   2. Need for pneumococcal vaccination Pneumovax-23  3. Prostate cancer screening PSA    Lelon Huh, MD  [facility]

## 2017-12-05 ENCOUNTER — Telehealth: Payer: Self-pay | Admitting: *Deleted

## 2017-12-05 LAB — COMPREHENSIVE METABOLIC PANEL
ALBUMIN: 4.5 g/dL (ref 3.6–4.8)
ALK PHOS: 80 IU/L (ref 39–117)
ALT: 36 IU/L (ref 0–44)
AST: 23 IU/L (ref 0–40)
Albumin/Globulin Ratio: 1.6 (ref 1.2–2.2)
BUN / CREAT RATIO: 16 (ref 10–24)
BUN: 16 mg/dL (ref 8–27)
Bilirubin Total: 1.4 mg/dL — ABNORMAL HIGH (ref 0.0–1.2)
CHLORIDE: 104 mmol/L (ref 96–106)
CO2: 26 mmol/L (ref 20–29)
Calcium: 9.7 mg/dL (ref 8.6–10.2)
Creatinine, Ser: 0.99 mg/dL (ref 0.76–1.27)
GFR calc Af Amer: 91 mL/min/{1.73_m2} (ref >59)
GFR calc non Af Amer: 79 mL/min/{1.73_m2} (ref >59)
GLOBULIN, TOTAL: 2.8 g/dL (ref 1.5–4.5)
Glucose: 100 mg/dL — ABNORMAL HIGH (ref 65–99)
Potassium: 5.3 mmol/L — ABNORMAL HIGH (ref 3.5–5.2)
SODIUM: 142 mmol/L (ref 134–144)
Total Protein: 7.3 g/dL (ref 6.0–8.5)

## 2017-12-05 LAB — LIPID PANEL
CHOLESTEROL TOTAL: 156 mg/dL (ref 100–199)
Chol/HDL Ratio: 2.6 ratio (ref 0.0–5.0)
HDL: 59 mg/dL (ref >39)
LDL CALC: 82 mg/dL (ref 0–99)
Triglycerides: 74 mg/dL (ref 0–149)
VLDL Cholesterol Cal: 15 mg/dL (ref 5–40)

## 2017-12-05 LAB — PSA: Prostate Specific Ag, Serum: 0.6 ng/mL (ref 0.0–4.0)

## 2017-12-05 NOTE — Telephone Encounter (Signed)
LMOVM for pt to return call 

## 2017-12-05 NOTE — Telephone Encounter (Signed)
Patient advised as below.  

## 2017-12-05 NOTE — Telephone Encounter (Signed)
Pt returned call and requested call back on mobil #. Please advise. Thanks TNP

## 2017-12-05 NOTE — Telephone Encounter (Signed)
-----   Message from Birdie Sons, MD sent at 12/05/2017  7:47 AM EDT ----- PSA, blood sugar, kidney functions, electrolytes and cholesterol are all normal. Check labs yearly.

## 2018-05-07 ENCOUNTER — Emergency Department
Admission: EM | Admit: 2018-05-07 | Discharge: 2018-05-07 | Disposition: A | Discharge status: Home / Self Care | Payer: Managed Care, Other (non HMO) | Attending: Emergency Medicine | Admitting: Emergency Medicine

## 2018-05-07 ENCOUNTER — Emergency Department: Payer: Managed Care, Other (non HMO)

## 2018-05-07 ENCOUNTER — Encounter: Payer: Self-pay | Admitting: Emergency Medicine

## 2018-05-07 ENCOUNTER — Other Ambulatory Visit: Payer: Self-pay

## 2018-05-07 DIAGNOSIS — D124 Benign neoplasm of descending colon: Secondary | ICD-10-CM | POA: Diagnosis not present

## 2018-05-07 DIAGNOSIS — R945 Abnormal results of liver function studies: Secondary | ICD-10-CM | POA: Diagnosis not present

## 2018-05-07 DIAGNOSIS — R1013 Epigastric pain: Secondary | ICD-10-CM

## 2018-05-07 DIAGNOSIS — R7989 Other specified abnormal findings of blood chemistry: Secondary | ICD-10-CM

## 2018-05-07 DIAGNOSIS — D122 Benign neoplasm of ascending colon: Secondary | ICD-10-CM | POA: Diagnosis not present

## 2018-05-07 DIAGNOSIS — K8020 Calculus of gallbladder without cholecystitis without obstruction: Secondary | ICD-10-CM | POA: Diagnosis not present

## 2018-05-07 DIAGNOSIS — D123 Benign neoplasm of transverse colon: Secondary | ICD-10-CM | POA: Insufficient documentation

## 2018-05-07 LAB — COMPREHENSIVE METABOLIC PANEL
ALBUMIN: 4.5 g/dL (ref 3.5–5.0)
ALT: 394 U/L — AB (ref 0–44)
AST: 521 U/L — ABNORMAL HIGH (ref 15–41)
Alkaline Phosphatase: 101 U/L (ref 38–126)
Anion gap: 8 (ref 5–15)
BUN: 14 mg/dL (ref 8–23)
CALCIUM: 9.5 mg/dL (ref 8.9–10.3)
CO2: 26 mmol/L (ref 22–32)
CREATININE: 0.86 mg/dL (ref 0.61–1.24)
Chloride: 105 mmol/L (ref 98–111)
GFR calc non Af Amer: >60 mL/min (ref >60)
GLUCOSE: 168 mg/dL — AB (ref 70–99)
Potassium: 4.3 mmol/L (ref 3.5–5.1)
SODIUM: 139 mmol/L (ref 135–145)
Total Bilirubin: 2.5 mg/dL — ABNORMAL HIGH (ref 0.3–1.2)
Total Protein: 7.9 g/dL (ref 6.5–8.1)

## 2018-05-07 LAB — CBC
HEMATOCRIT: 47.7 % (ref 40.0–52.0)
HEMOGLOBIN: 16.8 g/dL (ref 13.0–18.0)
MCH: 34.3 pg — ABNORMAL HIGH (ref 26.0–34.0)
MCHC: 35.2 g/dL (ref 32.0–36.0)
MCV: 97.6 fL (ref 80.0–100.0)
Platelets: 207 10*3/uL (ref 150–440)
RBC: 4.89 MIL/uL (ref 4.40–5.90)
RDW: 12.4 % (ref 11.5–14.5)
WBC: 12 10*3/uL — ABNORMAL HIGH (ref 3.8–10.6)

## 2018-05-07 LAB — LIPASE, BLOOD: Lipase: 29 U/L (ref 11–51)

## 2018-05-07 MED ORDER — IOPAMIDOL (ISOVUE-300) INJECTION 61%
100.0000 mL | Freq: Once | INTRAVENOUS | Status: AC | PRN
  Administered 2018-05-07: 100 mL via INTRAVENOUS
  Filled 2018-05-07: qty 100

## 2018-05-07 NOTE — Discharge Instructions (Signed)
As we discussed please talk to your doctor to get your liver function tests redrawn in the next few days. Please seek medical attention for any high fevers, chest pain, shortness of breath, change in behavior, persistent vomiting, bloody stool or any other new or concerning symptoms.

## 2018-05-07 NOTE — ED Notes (Signed)
Pt states he did have some nausea with the pain this am, and intermittently ever since. Does have his appendix and gall bladder, no hx of kidney stones.

## 2018-05-07 NOTE — ED Notes (Signed)
Pt ambulatory to POV without difficulty. VSS. NAD. Discharge instructions, RX and follow up reviewed. All questions and concerns addressed.  

## 2018-05-07 NOTE — ED Triage Notes (Signed)
Epigastric pain began 2am. Some diaphoresis.

## 2018-05-07 NOTE — ED Provider Notes (Signed)
[facility] Emergency Department Provider Note  __________________________________   I have reviewed the triage vital signs and the nursing notes.   HISTORY  Chief Complaint Abdominal Pain   History limited by: Not Limited   HPI Andrew Villanueva is a 67 y.o. male who presents to the emergency department today because of concerns for abdominal pain.  He states that the pain first woke him up early this morning and then started to subside.  He has since had 2 further episodes of pain.  Located in the epigastric right upper quadrant.  It does radiate to his back.  He has not had any vomiting but has had some associated nausea.  He denies similar pain in the past.  Denies any change in bowel movements today.  Denies any fevers.  Denies any surgeries in his abdomen.   Per medical record review patient has a history of history of measles.   Past Medical History:  Diagnosis Date  . History of measles   . Wears dentures    upper partial    Patient Active Problem List   Diagnosis Date Noted  . Special screening for malignant neoplasms, colon   . Benign neoplasm of ascending colon   . Benign neoplasm of transverse colon   . Benign neoplasm of descending colon   . Obesity 11/02/2015  . Elevated blood pressure 11/02/2015  . 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 Family history of cardiovascular disease 10/25/2006    Past Surgical History:  Procedure Laterality Date  . COLONOSCOPY    . COLONOSCOPY WITH PROPOFOL N/A 01/23/2017   Procedure: COLONOSCOPY WITH PROPOFOL;  Surgeon: Lucilla Lame, MD;  Location: [place];  Service: Endoscopy;  Laterality: N/A;  . POLYPECTOMY  01/23/2017   Procedure: POLYPECTOMY;  Surgeon: Lucilla Lame, MD;  Location: [place];  Service: Endoscopy;;    Prior to Admission medications   Not on File    Allergies Patient has no known allergies.  Family History  Problem Relation Age of Onset  . Diabetes Mother   . Heart  attack Mother   . CAD Father   . Diabetes Father   . Lung cancer Brother   . Colon cancer Neg Hx   . Prostate cancer Neg Hx     Social History Social History   Tobacco Use  . Smoking status: Never Smoker  . Smokeless tobacco: Never Used  Substance Use Topics  . Alcohol use: Yes    Alcohol/week: 2.0 standard drinks    Types: 2 Cans of beer per week    Comment: minimal alcohol consumption  . Drug use: No    Review of Systems Constitutional: No fever/chills Eyes: No visual changes. ENT: No sore throat. Cardiovascular: Denies chest pain. Respiratory: Denies shortness of breath. Gastrointestinal: Positive for abdominal pain and nausea.  Genitourinary: Negative for dysuria. Musculoskeletal: Negative for back pain. Skin: Negative for rash. Neurological: Negative for headaches, focal weakness or numbness.  ____________________________________________   PHYSICAL EXAM:  VITAL SIGNS: ED Triage Vitals  Enc Vitals Group     BP 05/07/18 1503 126/80     Pulse Rate 05/07/18 1503 92     Resp 05/07/18 1503 18     Temp 05/07/18 1503 (!) 97.5 F (36.4 C)     Temp Source 05/07/18 1503 Oral     SpO2 05/07/18 1503 100 %     Weight 05/07/18 1504 230 lb (104.3 kg)     Height 05/07/18 1504 6\' 1"  (1.854 m)  Head Circumference --      Peak Flow --      Pain Score 05/07/18 1504 8   Constitutional: Alert and oriented.  Eyes: Conjunctivae are normal.  ENT      Head: Normocephalic and atraumatic.      Nose: No congestion/rhinnorhea.      Mouth/Throat: Mucous membranes are moist.      Neck: No stridor. Hematological/Lymphatic/Immunilogical: No cervical lymphadenopathy. Cardiovascular: Normal rate, regular rhythm.  No murmurs, rubs, or gallops.  Respiratory: Normal respiratory effort without tachypnea nor retractions. Breath sounds are clear and equal bilaterally. No wheezes/rales/rhonchi. Gastrointestinal: Soft and minimally tender in the epigastric region. No rebound. No guarding.   Genitourinary: Deferred Musculoskeletal: Normal range of motion in all extremities. No lower extremity edema. Neurologic:  Normal speech and language. No gross focal neurologic deficits are appreciated.  Skin:  Skin is warm, dry and intact. No rash noted. Psychiatric: Mood and affect are normal. Speech and behavior are normal. Patient exhibits appropriate insight and judgment.  ____________________________________________    LABS (pertinent positives/negatives)  CBC wbc 12.0, hgb 16.8, plt 207 CMP wnl except glu 168, ast 521, alt 394, t bili 2.5  ____________________________________________   EKG  I, Nance Pear, attending physician, personally viewed and interpreted this EKG  EKG Time: 1502 Rate: 93 Rhythm: normal sinus rhythm Axis: normal Intervals: qtc 445 QRS: narrow, q waves v1, v2 III ST changes: no st elevation Impression: abnormal ekg   ____________________________________________    RADIOLOGY  [place] RUQ Gallstones, gbw thickening  CT abd/pel Gallstones, no acute abnormality  ____________________________________________   PROCEDURES  Procedures  ____________________________________________   INITIAL IMPRESSION / ASSESSMENT AND PLAN / ED COURSE  Pertinent labs & imaging results that were available during my care of the patient were reviewed by me and considered in my medical decision making (see chart for details).   Patient presented to the emergency department today because of concerns for epigastric abdominal pain.  On exam patient very minimally tender.  Lab work does show some mild elevation of some of the LFTs.  Right upper quadrant ultrasound was obtained which shows some gallstones and some slight gallbladder wall thickening but no other findings consistent with acute cholecystitis.  CT abdomen pelvis was obtained to get further imaging of the distal ducts without any concerning findings.  Patient's pain continued to be resolved here in the  emergency department.  This point I had a discussion with the patient made given that the patient's pain has resolved do think is reasonable for patient be discharged.  At this point I think either patient suffering from biliary colic versus possibly a passed stone.  Discussed these with the patient.  Discussed with patient importance of getting LFTs rechecked.  Will give patient surgery follow-up information.  Discussed strict return precautions.  ____________________________________________   FINAL CLINICAL IMPRESSION(S) / ED DIAGNOSES  Final diagnoses:  Epigastric abdominal pain  Calculus of gallbladder without cholecystitis without obstruction  Elevated liver function tests     Note: This dictation was prepared with Dragon dictation. Any transcriptional errors that result from this process are unintentional     Nance Pear, MD 05/07/18 2035

## 2018-05-08 ENCOUNTER — Telehealth: Payer: Self-pay | Admitting: Family Medicine

## 2018-05-08 NOTE — Telephone Encounter (Signed)
Pt went to ER with gallstones.  Pt had elevated liver levels as well.  ER told pt he will need a follow up in labs for testing his liver levels with Dr. Caryn Section.  Please call pt back to let them know what is needed next.  Thanks, [employer]

## 2018-05-08 NOTE — Telephone Encounter (Signed)
Returned call to patient's wife Constance Holster and scheduled an ER follow up for 05/09/2018 at 10:40 am.

## 2018-05-09 ENCOUNTER — Telehealth: Payer: Self-pay | Admitting: *Deleted

## 2018-05-09 ENCOUNTER — Other Ambulatory Visit: Payer: Self-pay

## 2018-05-09 ENCOUNTER — Encounter: Payer: Self-pay | Admitting: Family Medicine

## 2018-05-09 ENCOUNTER — Ambulatory Visit: Payer: Managed Care, Other (non HMO) | Admitting: Family Medicine

## 2018-05-09 VITALS — BP 133/83 | HR 68 | Temp 98.0°F | Resp 16 | Wt 223.0 lb

## 2018-05-09 DIAGNOSIS — R740 Nonspecific elevation of levels of transaminase and lactic acid dehydrogenase [LDH]: Secondary | ICD-10-CM

## 2018-05-09 DIAGNOSIS — K760 Fatty (change of) liver, not elsewhere classified: Secondary | ICD-10-CM | POA: Diagnosis not present

## 2018-05-09 DIAGNOSIS — M65319 Trigger thumb, unspecified thumb: Secondary | ICD-10-CM | POA: Insufficient documentation

## 2018-05-09 DIAGNOSIS — K4090 Unilateral inguinal hernia, without obstruction or gangrene, not specified as recurrent: Secondary | ICD-10-CM | POA: Diagnosis not present

## 2018-05-09 DIAGNOSIS — K8020 Calculus of gallbladder without cholecystitis without obstruction: Secondary | ICD-10-CM | POA: Diagnosis not present

## 2018-05-09 DIAGNOSIS — R7402 Elevation of levels of lactic acid dehydrogenase (LDH): Secondary | ICD-10-CM

## 2018-05-09 DIAGNOSIS — R7401 Elevation of levels of liver transaminase levels: Secondary | ICD-10-CM

## 2018-05-09 HISTORY — DX: Calculus of gallbladder without cholecystitis without obstruction: K80.20

## 2018-05-09 NOTE — Telephone Encounter (Signed)
Patient contacted today and offered an appointment with Dr. Dahlia Byes on 05-09-18 at 3:15 pm but patient declined. The patient states he saw Dr. Caryn Section at [facility] this morning and wishes to wait until lab work comes back before he does anything.

## 2018-05-09 NOTE — Progress Notes (Addendum)
Patient: Andrew Villanueva Male    DOB: 08/10/1950   67 y.o.   MRN: [id number] Visit Date: 05/09/2018  Today's Provider: Lelon Huh, MD   Chief Complaint  Patient presents with  . Hospitalization Follow-up   Subjective:    HPI   Follow up ER visit                  Patient was seen in ER for Epigastri abdominal pain, Calculus of gallbladde   without cholecystitis without obstruction and Elevated liver function on 05/07/2018.  He was treated for Epigastric abdominal pain, Calculus of gallbladder without cholecystitis without obstruction and Elevated liver function.  Treatment for this included; Labs shoed some mild elevation of some of the LFTs.  [place] RUQ was obtained showing some gallstones and some slight gallbladder wall thickening but no other findings consistent with acute cholecystitis.  CT revelaed diffuse hepatic steatosis, cholelithiasis, and small left inguinal hernia which he was not previously aware of.  He reports good compliance with treatment. He reports this condition is Improved.  Results for orders placed or performed during the hospital encounter of 05/07/18  Lipase, blood  Result Value Ref Range   Lipase 29 11 - 51 U/L  Comprehensive metabolic panel  Result Value Ref Range   Sodium 139 135 - 145 mmol/L   Potassium 4.3 3.5 - 5.1 mmol/L   Chloride 105 98 - 111 mmol/L   CO2 26 22 - 32 mmol/L   Glucose, Bld 168 (H) 70 - 99 mg/dL   BUN 14 8 - 23 mg/dL   Creatinine, Ser 0.86 0.61 - 1.24 mg/dL   Calcium 9.5 8.9 - 10.3 mg/dL   Total Protein 7.9 6.5 - 8.1 g/dL   Albumin 4.5 3.5 - 5.0 g/dL   AST 521 (H) 15 - 41 U/L   ALT 394 (H) 0 - 44 U/L   Alkaline Phosphatase 101 38 - 126 U/L   Total Bilirubin 2.5 (H) 0.3 - 1.2 mg/dL   GFR calc non Af Amer >60 >60 mL/min   GFR calc Af Amer >60 >60 mL/min   Anion gap 8 5 - 15  CBC  Result Value Ref Range   WBC 12.0 (H) 3.8 - 10.6 K/uL   RBC 4.89 4.40 - 5.90 MIL/uL   Hemoglobin 16.8 13.0 - 18.0 g/dL   HCT 47.7 40.0 -  52.0 %   MCV 97.6 80.0 - 100.0 fL   MCH 34.3 (H) 26.0 - 34.0 pg   MCHC 35.2 32.0 - 36.0 g/dL   RDW 12.4 11.5 - 14.5 %   Platelets 207 150 - 440 K/uL    ------------------------------------------------------  Patient states that he is feeling better since ER visit on 05/07/2018. His abdominal pain has almost resolved. However patient states that he is still having lingering nausea and less appetite  He states pains woke him at about 2am on 05-07-2018 and eased up through the night. He had a light breakfast and started having more severe epigastric pain and nausea around 10:30 in the morning while at work, leading him to go to ER for evaluation.   No Known Allergies  No current outpatient medications on file.  Review of Systems  Constitutional: Negative for appetite change, chills and fever.  Respiratory: Negative for chest tightness, shortness of breath and wheezing.   Cardiovascular: Negative for chest pain and palpitations.  Gastrointestinal: Negative for abdominal pain, nausea and vomiting.    Social History   Tobacco Use  .  Smoking status: Never Smoker  . Smokeless tobacco: Never Used  Substance Use Topics  . Alcohol use: Yes    Alcohol/week: 2.0 standard drinks    Types: 2 Cans of beer per week    Comment: minimal alcohol consumption   Objective:   BP 133/83 (BP Location: Right Arm, Patient Position: Sitting, Cuff Size: Large)   Pulse 68   Temp 98 F (36.7 C) (Oral)   Resp 16   Wt 223 lb (101.2 kg)   SpO2 96%   BMI 29.42 kg/m  Vitals:   05/09/18 1054  BP: 133/83  Pulse: 68  Resp: 16  Temp: 98 F (36.7 C)  TempSrc: Oral  SpO2: 96%  Weight: 223 lb (101.2 kg)     Physical Exam  General Appearance:    Alert, cooperative, no distress  Eyes:    PERRL, conjunctiva/corneas clear, EOM's intact       Lungs:     Clear to auscultation bilaterally, respirations unlabored  Heart:    Regular rate and rhythm  Abdomen:   bowel sounds present and normal in all 4  quadrants, soft or round. No CVA tenderness. Slight epigastric tenderness.         Assessment & Plan:     1. Elevated transaminase level Likely secondary to passing of gallstones. May be exacerbated by underlying fatty liver disease. Recheck today. Consider checking viral hepatitis titers.   2. Calculus of gallbladder without cholecystitis without obstruction Anticipate surgery referral to discuss cholecystectomy.   3. Left inguinal hernia Incidental finding on abdominal CT. Asymptomatic.   4. Fatty liver Newly diagnosed on abdominal Ct.        Lelon Huh, MD  [facility]

## 2018-05-09 NOTE — Patient Instructions (Signed)
If you liver functions are coming down, then I recommend seeing a surgeon to discuss removal of your gallbladder.

## 2018-05-10 ENCOUNTER — Other Ambulatory Visit: Payer: Self-pay | Admitting: Family Medicine

## 2018-05-10 ENCOUNTER — Telehealth: Payer: Self-pay | Admitting: Family Medicine

## 2018-05-10 DIAGNOSIS — R748 Abnormal levels of other serum enzymes: Secondary | ICD-10-CM

## 2018-05-10 DIAGNOSIS — K760 Fatty (change of) liver, not elsewhere classified: Secondary | ICD-10-CM | POA: Insufficient documentation

## 2018-05-10 DIAGNOSIS — K8080 Other cholelithiasis without obstruction: Secondary | ICD-10-CM

## 2018-05-10 LAB — COMPREHENSIVE METABOLIC PANEL
A/G RATIO: 1.4 (ref 1.2–2.2)
ALT: 410 IU/L — AB (ref 0–44)
AST: 153 IU/L — AB (ref 0–40)
Albumin: 4.2 g/dL (ref 3.6–4.8)
Alkaline Phosphatase: 142 IU/L — ABNORMAL HIGH (ref 39–117)
BILIRUBIN TOTAL: 4 mg/dL — AB (ref 0.0–1.2)
BUN/Creatinine Ratio: 9 — ABNORMAL LOW (ref 10–24)
BUN: 9 mg/dL (ref 8–27)
CALCIUM: 9.3 mg/dL (ref 8.6–10.2)
CHLORIDE: 103 mmol/L (ref 96–106)
CO2: 25 mmol/L (ref 20–29)
Creatinine, Ser: 0.96 mg/dL (ref 0.76–1.27)
GFR calc Af Amer: 95 mL/min/{1.73_m2} (ref >59)
GFR, EST NON AFRICAN AMERICAN: 82 mL/min/{1.73_m2} (ref >59)
Globulin, Total: 2.9 g/dL (ref 1.5–4.5)
Glucose: 107 mg/dL — ABNORMAL HIGH (ref 65–99)
POTASSIUM: 4.1 mmol/L (ref 3.5–5.2)
Sodium: 142 mmol/L (ref 134–144)
Total Protein: 7.1 g/dL (ref 6.0–8.5)

## 2018-05-10 NOTE — Telephone Encounter (Signed)
Needing liver blood levels from labs and lab results. Please call pt back to discuss.  Thanks, [employer]

## 2018-05-10 NOTE — Progress Notes (Signed)
Patient's lab results have been sent to PCP.

## 2018-05-10 NOTE — Telephone Encounter (Signed)
Advised patient some of his labs are still pending.

## 2018-05-14 LAB — SPECIMEN STATUS REPORT

## 2018-05-14 LAB — HEPATITIS A ANTIBODY, IGM

## 2018-05-14 LAB — HEPATITIS B SURFACE ANTIGEN

## 2018-05-14 LAB — HEPATITIS B SURFACE ANTIBODY, QUANTITATIVE

## 2018-05-14 LAB — HEPATITIS C ANTIBODY

## 2018-05-14 NOTE — Telephone Encounter (Signed)
Pt advised.  Labs left at the front desk for pt.   Thanks,   -Mickel Baas

## 2018-05-14 NOTE — Telephone Encounter (Signed)
Pt's wife calling back for pt's other lab results.    Please call pt back asap at [phone].  Thanks, [employer]

## 2018-05-14 NOTE — Telephone Encounter (Signed)
Some liver functions were better, some were worse, but overall looks consistent with gallbladder disease.  We asked [facility] to add Hepatitis panel to make sure there is no infection from viral hepatitis, but they were not able to add these tests.  Recommend proceed with referral to surgery to discuss cholecystectomy. Should also have blood drawn for hepatitis A IgM, hepatitis b surfacee antigen, hepatitis b surface antibody, hepatitis B core antibody, and hepatitis c antibody.

## 2018-05-14 NOTE — Addendum Note (Signed)
Addended by: Ashley Royalty E on: 05/14/2018 12:16 PM   Modules accepted: Orders

## 2018-05-15 LAB — HEPATITIS B SURFACE ANTIBODY,QUALITATIVE: Hep B Surface Ab, Qual: NONREACTIVE

## 2018-05-15 LAB — HEPATITIS B CORE ANTIBODY, IGM: HEP B C IGM: NEGATIVE

## 2018-05-15 LAB — HEPATITIS B SURFACE ANTIGEN: Hepatitis B Surface Ag: NEGATIVE

## 2018-05-15 LAB — HEPATITIS C ANTIBODY: Hep C Virus Ab: <0.1 s/co ratio (ref 0.0–0.9)

## 2018-05-15 LAB — HEPATITIS A ANTIBODY, IGM: HEP A IGM: NEGATIVE

## 2018-05-17 ENCOUNTER — Ambulatory Visit: Payer: Managed Care, Other (non HMO) | Admitting: Surgery

## 2018-05-17 ENCOUNTER — Encounter: Payer: Self-pay | Admitting: Surgery

## 2018-05-17 VITALS — BP 166/96 | HR 58 | Temp 97.3°F | Resp 14 | Ht 73.0 in | Wt 229.0 lb

## 2018-05-17 DIAGNOSIS — R945 Abnormal results of liver function studies: Secondary | ICD-10-CM

## 2018-05-17 DIAGNOSIS — K8020 Calculus of gallbladder without cholecystitis without obstruction: Secondary | ICD-10-CM | POA: Diagnosis not present

## 2018-05-17 DIAGNOSIS — R7989 Other specified abnormal findings of blood chemistry: Secondary | ICD-10-CM

## 2018-05-17 NOTE — H&P (View-Only) (Signed)
Surgical Clinic History and Physical  Referring provider:  Birdie Sons, MD [address] [place],  [address]  HISTORY OF PRESENT ILLNESS (HPI):  67 y.o. male presents for evaluation of abdominal pain. Patient reports he first experienced RUQ > epigastric abdominal pain x 3 episodes within 24 hours after eating barbecued meats. He also at that time describes +fever/chills and N/V with nausea continuing to occur x 3 days following onset of his RUQ > epigastric abdominal pain. When patient was evaluated at [facility] ED, he was told his total bilirubin was a little elevated and advised to follow-up for this with his primary care physician, who rechecked patient's CMP and checked a hepatitis panel, the latter of which was negative. Patient also describes he typically experiences looser BM's x may years until they've become more formed over only the past few days without constipation. Patient otherwise denies any family history of biliary, pancreatic, or GI malignancies.  PAST MEDICAL HISTORY (PMH):  Past Medical History:  Diagnosis Date  . History of measles   . Wears dentures    upper partial     PAST SURGICAL HISTORY ([place]):  Past Surgical History:  Procedure Laterality Date  . COLONOSCOPY    . COLONOSCOPY WITH PROPOFOL N/A 01/23/2017   Procedure: COLONOSCOPY WITH PROPOFOL;  Surgeon: Lucilla Lame, MD;  Location: [place];  Service: Endoscopy;  Laterality: N/A;  . POLYPECTOMY  01/23/2017   Procedure: POLYPECTOMY;  Surgeon: Lucilla Lame, MD;  Location: [place];  Service: Endoscopy;;     MEDICATIONS:  Prior to Admission medications   Not on File     ALLERGIES:  No Known Allergies   SOCIAL HISTORY:  Social History   Socioeconomic History  . Marital status: Married    Spouse name: Not on file  . Number of children: 2  . Years of education: Not on file  . Highest education level: Not on file  Occupational History  . Occupation: Warehouse manager  Social Needs  . Financial resource strain: Not on file  . Food insecurity:    Worry: Not on file    Inability: Not on file  . Transportation needs:    Medical: Not on file    Non-medical: Not on file  Tobacco Use  . Smoking status: Never Smoker  . Smokeless tobacco: Never Used  Substance and Sexual Activity  . Alcohol use: Yes    Alcohol/week: 2.0 standard drinks    Types: 2 Cans of beer per week    Comment: minimal alcohol consumption  . Drug use: No  . Sexual activity: Not on file  Lifestyle  . Physical activity:    Days per week: Not on file    Minutes per session: Not on file  . Stress: Not on file  Relationships  . Social connections:    Talks on phone: Not on file    Gets together: Not on file    Attends religious service: Not on file    Active member of club or organization: Not on file    Attends meetings of clubs or organizations: Not on file    Relationship status: Not on file  . Intimate partner violence:    Fear of current or ex partner: Not on file    Emotionally abused: Not on file    Physically abused: Not on file    Forced sexual activity: Not on file  Other Topics Concern  . Not on file  Social History Narrative  . Not  on file    The patient currently resides (home / rehab facility / nursing home): Home The patient normally is (ambulatory / bedbound): Ambulatory  FAMILY HISTORY:  Family History  Problem Relation Age of Onset  . Diabetes Mother   . Heart attack Mother   . CAD Father   . Diabetes Father   . Lung cancer Brother   . Colon cancer Neg Hx   . Prostate cancer Neg Hx     Otherwise negative/non-contributory.  REVIEW OF SYSTEMS:  Constitutional: denies any other weight loss, fever, chills, or sweats  Eyes: denies any other vision changes, history of eye injury  ENT: denies sore throat, hearing problems  Respiratory: denies shortness of breath, wheezing  Cardiovascular: denies chest pain, palpitations  Gastrointestinal:  abdominal pain, N/V, and bowel function as per HPI Musculoskeletal: denies any other joint pains or cramps  Skin: Denies any other rashes or skin discolorations Neurological: denies any other headache, dizziness, weakness  Psychiatric: Denies any other depression, anxiety   All other review of systems were otherwise negative   VITAL SIGNS:  BP (!) 166/96   Pulse (!) 58   Temp (!) 97.3 F (36.3 C) (Skin)   Resp 14   Ht 6\' 1"  (1.854 m)   Wt 229 lb (103.9 kg)   BMI 30.21 kg/m   PHYSICAL EXAM:  Constitutional:  -- Normal body habitus  -- Awake, alert, and oriented x3  Eyes:  -- Pupils equally round and reactive to light  -- No scleral icterus  Ear, nose, throat:  -- No jugular venous distension -- No nasal drainage, bleeding Pulmonary:  -- No crackles  -- Equal breath sounds bilaterally -- Breathing non-labored at rest Cardiovascular:  -- S1, S2 present  -- No pericardial rubs  Gastrointestinal:  -- Abdomen soft, nontender, and non-distended with no guarding/rebound tenderness -- No abdominal masses appreciated, pulsatile or otherwise  Musculoskeletal and Integumentary:  -- Wounds or skin discoloration: None appreciated -- Extremities: B/L UE and LE FROM, hands and feet warm, no edema  Neurologic:  -- Motor function: Intact and symmetric -- Sensation: Intact and symmetric  Labs:  CBC Latest Ref Rng & Units 05/07/2018 11/21/2016  WBC 3.8 - 10.6 K/uL 12.0(H) 6.2  Hemoglobin 13.0 - 18.0 g/dL 16.8 16.5  Hematocrit 40.0 - 52.0 % 47.7 48.3  Platelets 150 - 440 K/uL 207 236   CMP Latest Ref Rng & Units 05/09/2018 05/07/2018 12/04/2017  Glucose 65 - 99 mg/dL 107(H) 168(H) 100(H)  BUN 8 - 27 mg/dL 9 14 16   Creatinine 0.76 - 1.27 mg/dL 0.96 0.86 0.99  Sodium 134 - 144 mmol/L 142 139 142  Potassium 3.5 - 5.2 mmol/L 4.1 4.3 5.3(H)  Chloride 96 - 106 mmol/L 103 105 104  CO2 20 - 29 mmol/L 25 26 26   Calcium 8.6 - 10.2 mg/dL 9.3 9.5 9.7  Total Protein 6.0 - 8.5 g/dL 7.1 7.9 7.3   Total Bilirubin 0.0 - 1.2 mg/dL 4.0(H) 2.5(H) 1.4(H)  Alkaline Phos 39 - 117 IU/L 142(H) 101 80  AST 0 - 40 IU/L 153(H) 521(H) 23  ALT 0 - 44 IU/L 410(H) 394(H) 36   Imaging studies:  Limited RUQ Abdominal Ultrasound (05/07/2018) Gallbladder wall is 3.5 millimeters in thickness. A single mobile stone measures 13 millimeters. No sonographic Murphy sign. No pericholecystic fluid. Common bile duct diameter: 4.6 mm.  CT Abdomen and Pelvis with Contrast (05/07/2018) - personally reviewed and discussed with patient and his wife 1. No acute abnormality. 2. Diffuse hepatic  steatosis. 3. Cholelithiasis. 4. Coronary artery atherosclerosis. - specifically: Hepatobiliary: Diffuse low density of the liver relative to the spleen. Previously demonstrated gallstone in the gallbladder, measure 7 mm. No gallbladder wall thickening or pericholecystic fluid. Pancreas: Unremarkable. No pancreatic ductal dilatation or surrounding inflammatory changes.  Assessment/Plan:  67 y.o. male with worsening abnormal LFT's following an episode of RUQ abdominal pain in the context of cholelithiasis.   - discussed lab and imaging results  - will repeat CMP with fractionated bilirubin  - if improved, recommend cholecystectomy with IOC  - if not improved/worse, will plan to check MRCP with likely GI referral  - all risks, benefits, and alternatives to cholecystectomy were discussed with the patient, all of his questions were answered to his expressed satisfaction, patient expresses he wishes to proceed, and informed consent was obtained.  - anticipate return to clinic 2 weeks following above procedure  - instructed to call if any questions or concerns  All of the above recommendations were discussed with the patient, and all of patient's and family's questions were answered to their expressed satisfaction.  Thank you for the opportunity to participate in this patient's care.  -- Marilynne Drivers Rosana Hoes, MD, [place]: [facility] - Partnering for exceptional care. Office: [phone]

## 2018-05-17 NOTE — Progress Notes (Signed)
Surgical Clinic History and Physical  Referring provider:  Birdie Sons, MD [address] [place], [place] [address]  HISTORY OF PRESENT ILLNESS (HPI):  67 y.o. male presents for evaluation of abdominal pain. Patient reports he first experienced RUQ > epigastric abdominal pain x 3 episodes within 24 hours after eating barbecued meats. He also at that time describes +fever/chills and N/V with nausea continuing to occur x 3 days following onset of his RUQ > epigastric abdominal pain. When patient was evaluated at [facility] ED, he was told his total bilirubin was a little elevated and advised to follow-up for this with his primary care physician, who rechecked patient's CMP and checked a hepatitis panel, the latter of which was negative. Patient also describes he typically experiences looser BM's x may years until they've become more formed over only the past few days without constipation. Patient otherwise denies any family history of biliary, pancreatic, or GI malignancies.  PAST MEDICAL HISTORY (PMH):  Past Medical History:  Diagnosis Date  . History of measles   . Wears dentures    upper partial     PAST SURGICAL HISTORY ([place]):  Past Surgical History:  Procedure Laterality Date  . COLONOSCOPY    . COLONOSCOPY WITH PROPOFOL N/A 01/23/2017   Procedure: COLONOSCOPY WITH PROPOFOL;  Surgeon: Lucilla Lame, MD;  Location: [place];  Service: Endoscopy;  Laterality: N/A;  . POLYPECTOMY  01/23/2017   Procedure: POLYPECTOMY;  Surgeon: Lucilla Lame, MD;  Location: [place];  Service: Endoscopy;;     MEDICATIONS:  Prior to Admission medications   Not on File     ALLERGIES:  No Known Allergies   SOCIAL HISTORY:  Social History   Socioeconomic History  . Marital status: Married    Spouse name: Not on file  . Number of children: 2  . Years of education: Not on file  . Highest education level: Not on file  Occupational History  . Occupation: Warehouse manager  Social Needs  . Financial resource strain: Not on file  . Food insecurity:    Worry: Not on file    Inability: Not on file  . Transportation needs:    Medical: Not on file    Non-medical: Not on file  Tobacco Use  . Smoking status: Never Smoker  . Smokeless tobacco: Never Used  Substance and Sexual Activity  . Alcohol use: Yes    Alcohol/week: 2.0 standard drinks    Types: 2 Cans of beer per week    Comment: minimal alcohol consumption  . Drug use: No  . Sexual activity: Not on file  Lifestyle  . Physical activity:    Days per week: Not on file    Minutes per session: Not on file  . Stress: Not on file  Relationships  . Social connections:    Talks on phone: Not on file    Gets together: Not on file    Attends religious service: Not on file    Active member of club or organization: Not on file    Attends meetings of clubs or organizations: Not on file    Relationship status: Not on file  . Intimate partner violence:    Fear of current or ex partner: Not on file    Emotionally abused: Not on file    Physically abused: Not on file    Forced sexual activity: Not on file  Other Topics Concern  . Not on file  Social History Narrative  . Not  on file    The patient currently resides (home / rehab facility / nursing home): Home The patient normally is (ambulatory / bedbound): Ambulatory  FAMILY HISTORY:  Family History  Problem Relation Age of Onset  . Diabetes Mother   . Heart attack Mother   . CAD Father   . Diabetes Father   . Lung cancer Brother   . Colon cancer Neg Hx   . Prostate cancer Neg Hx     Otherwise negative/non-contributory.  REVIEW OF SYSTEMS:  Constitutional: denies any other weight loss, fever, chills, or sweats  Eyes: denies any other vision changes, history of eye injury  ENT: denies sore throat, hearing problems  Respiratory: denies shortness of breath, wheezing  Cardiovascular: denies chest pain, palpitations  Gastrointestinal:  abdominal pain, N/V, and bowel function as per HPI Musculoskeletal: denies any other joint pains or cramps  Skin: Denies any other rashes or skin discolorations Neurological: denies any other headache, dizziness, weakness  Psychiatric: Denies any other depression, anxiety   All other review of systems were otherwise negative   VITAL SIGNS:  BP (!) 166/96   Pulse (!) 58   Temp (!) 97.3 F (36.3 C) (Skin)   Resp 14   Ht 6\' 1"  (1.854 m)   Wt 229 lb (103.9 kg)   BMI 30.21 kg/m   PHYSICAL EXAM:  Constitutional:  -- Normal body habitus  -- Awake, alert, and oriented x3  Eyes:  -- Pupils equally round and reactive to light  -- No scleral icterus  Ear, nose, throat:  -- No jugular venous distension -- No nasal drainage, bleeding Pulmonary:  -- No crackles  -- Equal breath sounds bilaterally -- Breathing non-labored at rest Cardiovascular:  -- S1, S2 present  -- No pericardial rubs  Gastrointestinal:  -- Abdomen soft, nontender, and non-distended with no guarding/rebound tenderness -- No abdominal masses appreciated, pulsatile or otherwise  Musculoskeletal and Integumentary:  -- Wounds or skin discoloration: None appreciated -- Extremities: B/L UE and LE FROM, hands and feet warm, no edema  Neurologic:  -- Motor function: Intact and symmetric -- Sensation: Intact and symmetric  Labs:  CBC Latest Ref Rng & Units 05/07/2018 11/21/2016  WBC 3.8 - 10.6 K/uL 12.0(H) 6.2  Hemoglobin 13.0 - 18.0 g/dL 16.8 16.5  Hematocrit 40.0 - 52.0 % 47.7 48.3  Platelets 150 - 440 K/uL 207 236   CMP Latest Ref Rng & Units 05/09/2018 05/07/2018 12/04/2017  Glucose 65 - 99 mg/dL 107(H) 168(H) 100(H)  BUN 8 - 27 mg/dL 9 14 16   Creatinine 0.76 - 1.27 mg/dL 0.96 0.86 0.99  Sodium 134 - 144 mmol/L 142 139 142  Potassium 3.5 - 5.2 mmol/L 4.1 4.3 5.3(H)  Chloride 96 - 106 mmol/L 103 105 104  CO2 20 - 29 mmol/L 25 26 26   Calcium 8.6 - 10.2 mg/dL 9.3 9.5 9.7  Total Protein 6.0 - 8.5 g/dL 7.1 7.9 7.3   Total Bilirubin 0.0 - 1.2 mg/dL 4.0(H) 2.5(H) 1.4(H)  Alkaline Phos 39 - 117 IU/L 142(H) 101 80  AST 0 - 40 IU/L 153(H) 521(H) 23  ALT 0 - 44 IU/L 410(H) 394(H) 36   Imaging studies:  Limited RUQ Abdominal Ultrasound (05/07/2018) Gallbladder wall is 3.5 millimeters in thickness. A single mobile stone measures 13 millimeters. No sonographic Murphy sign. No pericholecystic fluid. Common bile duct diameter: 4.6 mm.  CT Abdomen and Pelvis with Contrast (05/07/2018) - personally reviewed and discussed with patient and his wife 1. No acute abnormality. 2. Diffuse hepatic  steatosis. 3. Cholelithiasis. 4. Coronary artery atherosclerosis. - specifically: Hepatobiliary: Diffuse low density of the liver relative to the spleen. Previously demonstrated gallstone in the gallbladder, measure 7 mm. No gallbladder wall thickening or pericholecystic fluid. Pancreas: Unremarkable. No pancreatic ductal dilatation or surrounding inflammatory changes.  Assessment/Plan:  67 y.o. male with worsening abnormal LFT's following an episode of RUQ abdominal pain in the context of cholelithiasis.   - discussed lab and imaging results  - will repeat CMP with fractionated bilirubin  - if improved, recommend cholecystectomy with IOC  - if not improved/worse, will plan to check MRCP with likely GI referral  - all risks, benefits, and alternatives to cholecystectomy were discussed with the patient, all of his questions were answered to his expressed satisfaction, patient expresses he wishes to proceed, and informed consent was obtained.  - anticipate return to clinic 2 weeks following above procedure  - instructed to call if any questions or concerns  All of the above recommendations were discussed with the patient, and all of patient's and family's questions were answered to their expressed satisfaction.  Thank you for the opportunity to participate in this patient's care.  -- Marilynne Drivers Rosana Hoes, MD, [place]: [facility] - Partnering for exceptional care. Office: [phone]

## 2018-05-17 NOTE — Patient Instructions (Signed)

## 2018-05-18 ENCOUNTER — Other Ambulatory Visit: Payer: Self-pay | Admitting: Emergency Medicine

## 2018-05-18 NOTE — Addendum Note (Signed)
Addended by: Judie Petit on: 05/18/2018 03:52 PM   Modules accepted: Orders

## 2018-05-18 NOTE — Addendum Note (Signed)
Addended by: Judie Petit on: 05/18/2018 03:46 PM   Modules accepted: Orders

## 2018-05-19 LAB — COMPREHENSIVE METABOLIC PANEL
A/G RATIO: 1.5 (ref 1.2–2.2)
ALK PHOS: 152 IU/L — AB (ref 39–117)
ALT: 88 IU/L — ABNORMAL HIGH (ref 0–44)
AST: 33 IU/L (ref 0–40)
Albumin: 4.3 g/dL (ref 3.6–4.8)
BILIRUBIN TOTAL: 0.8 mg/dL (ref 0.0–1.2)
BUN / CREAT RATIO: 12 (ref 10–24)
BUN: 10 mg/dL (ref 8–27)
CHLORIDE: 102 mmol/L (ref 96–106)
CO2: 26 mmol/L (ref 20–29)
Calcium: 9.2 mg/dL (ref 8.6–10.2)
Creatinine, Ser: 0.81 mg/dL (ref 0.76–1.27)
GFR calc non Af Amer: 93 mL/min/{1.73_m2} (ref >59)
GFR, EST AFRICAN AMERICAN: 107 mL/min/{1.73_m2} (ref >59)
GLUCOSE: 91 mg/dL (ref 65–99)
Globulin, Total: 2.8 g/dL (ref 1.5–4.5)
POTASSIUM: 4.8 mmol/L (ref 3.5–5.2)
Sodium: 143 mmol/L (ref 134–144)
TOTAL PROTEIN: 7.1 g/dL (ref 6.0–8.5)

## 2018-05-21 ENCOUNTER — Encounter: Payer: Self-pay | Admitting: *Deleted

## 2018-05-21 ENCOUNTER — Telehealth: Payer: Self-pay | Admitting: *Deleted

## 2018-05-21 LAB — BILIRUBIN, FRACTIONATED(TOT/DIR/INDIR)
BILIRUBIN INDIRECT: 0.49 mg/dL (ref 0.10–0.80)
BILIRUBIN, DIRECT: 0.31 mg/dL (ref 0.00–0.40)
Bilirubin Total: 0.8 mg/dL (ref 0.0–1.2)

## 2018-05-21 LAB — SPECIMEN STATUS REPORT

## 2018-05-21 NOTE — Progress Notes (Signed)
Patient's surgery has been scheduled for 05-30-18 at [facility] with Dr. Rosana Hoes.  CMP results from [employer] are available from 05-18-18. Direct/indirect bilirubin were not done as requested. I have called [facility] this morning and spoken to [facility]. She will have this testing added on and results should be available in the next 24 hours. If bilirubin is not going down, patient will need an MRCP per Dr. Rosana Hoes.   The patient is aware to call the office should they have further questions.

## 2018-05-21 NOTE — Telephone Encounter (Signed)
Patient notified of normal labs and no further work up needed.   Will proceed with lap chole with grams as scheduled for 05-30-18 with Dr. Rosana Hoes.

## 2018-05-21 NOTE — Telephone Encounter (Signed)
-----   Message from Vickie Epley, MD sent at 05/21/2018  4:58 PM EDT ----- Faythe Ghee to proceed with lap chole with IOC, no further pre-op workup.  Thank you.         Corene Cornea   ----- Message ----- From: Dominga Ferry, CMA Sent: 05/21/2018  10:09 AM EDT To: Lesly Rubenstein, LPN, Vickie Epley, MD  Patient's CMP results came back from 05-18-18 but they did not do direct/indirect bilirubin. I have called [facility] and spoken with Shelah Lewandowsky to get this added on. Results should be available in the next 24 hours. #Caryl-Lyn- please follow up on this on Tuesday since I will not be here. If bilirubin is not coming down, Dr. Rosana Hoes wants to order an MRCP prior to lap chole with grams scheduled for 05-30-18. Does look like total bilirubin is down. Thanks.

## 2018-05-23 ENCOUNTER — Encounter
Admission: RE | Admit: 2018-05-23 | Discharge: 2018-05-23 | Disposition: A | Discharge status: Home / Self Care | Payer: Medicare Other | Source: Ambulatory Visit | Attending: Surgery | Admitting: Surgery

## 2018-05-23 ENCOUNTER — Other Ambulatory Visit: Payer: Self-pay

## 2018-05-23 HISTORY — DX: Gastro-esophageal reflux disease without esophagitis: K21.9

## 2018-05-23 HISTORY — DX: Other complications of anesthesia, initial encounter: T88.59XA

## 2018-05-23 HISTORY — DX: Unspecified osteoarthritis, unspecified site: M19.90

## 2018-05-23 HISTORY — DX: Adverse effect of unspecified anesthetic, initial encounter: T41.45XA

## 2018-05-23 NOTE — Patient Instructions (Signed)
Your procedure is scheduled on: 05-30-18  Report to Same Day Surgery 2nd floor medical mall [facility] Entrance-take elevator on left to 2nd floor.  Check in with surgery information desk.) To find out your arrival time please call [phone] between 1PM - 3PM on 05-29-18   Remember: Instructions that are not followed completely may result in serious medical risk, up to and including death, or upon the discretion of your surgeon and anesthesiologist your surgery may need to be rescheduled.    _x___ 1. Do not eat food after midnight the night before your procedure. You may drink clear liquids up to 2 hours before you are scheduled to arrive at the hospital for your procedure.  Do not drink clear liquids within 2 hours of your scheduled arrival to the hospital.  Clear liquids include  --Water or Apple juice without pulp  --Clear carbohydrate beverage such as ClearFast or Gatorade  --Black Coffee or Clear Tea (No milk, no creamers, do not add anything to  the coffee or Tea   ____Ensure clear carbohydrate drink on the way to the hospital for bariatric patients  ____Ensure clear carbohydrate drink 3 hours before surgery for Dr Dwyane Luo patients if physician instructed.   No gum chewing or hard candies.     __x__ 2. No Alcohol for 24 hours before or after surgery.   __x__3. No Smoking or e-cigarettes for 24 prior to surgery.  Do not use any chewable tobacco products for at least 6 hour prior to surgery   ____  4. Bring all medications with you on the day of surgery if instructed.    __x__ 5. Notify your doctor if there is any change in your medical condition     (cold, fever, infections).    x___6. On the morning of surgery brush your teeth with toothpaste and water.  You may rinse your mouth with mouth wash if you wish.  Do not swallow any toothpaste or mouthwash.   Do not wear jewelry, make-up, hairpins, clips or nail polish.  Do not wear lotions, powders, or perfumes. You may  wear deodorant.  Do not shave 48 hours prior to surgery. Men may shave face and neck.  Do not bring valuables to the hospital.    [facility] is not responsible for any belongings or valuables.               Contacts, dentures or bridgework may not be worn into surgery.  Leave your suitcase in the car. After surgery it may be brought to your room.  For patients admitted to the hospital, discharge time is determined by your treatment team.  _  Patients discharged the day of surgery will not be allowed to drive home.  You will need someone to drive you home and stay with you the night of your procedure.    Please read over the following fact sheets that you were given:   [facility] Preparing for Surgery   ____ Take anti-hypertensive listed below, cardiac, seizure, asthma,anti-reflux and psychiatric medicines. These include:  1. NONE  2.  3.  4.  5.  6.  ____Fleets enema or Magnesium Citrate as directed.   ____ Use CHG Soap or sage wipes as directed on instruction sheet   ____ Use inhalers on the day of surgery and bring to hospital day of surgery  ____ Stop Metformin and Janumet 2 days prior to surgery.    ____ Take 1/2 of usual insulin dose the night before surgery and  none on the morning surgery.   ____ Follow recommendations from Cardiologist, Pulmonologist or PCP regarding stopping Aspirin, Coumadin, Plavix ,Eliquis, Effient, or Pradaxa, and Pletal.  X____Stop Anti-inflammatories such as Advil, Aleve, Ibuprofen, Motrin, Naproxen, Naprosyn, Goodies powders or aspirin products NOW- OK to take Tylenol    ____ Stop supplements until after surgery.     ____ Bring C-Pap to the hospital.

## 2018-05-29 MED ORDER — CEFAZOLIN SODIUM-DEXTROSE 2-4 GM/100ML-% IV SOLN
2.0000 g | INTRAVENOUS | Status: AC
  Administered 2018-05-30: 2 g via INTRAVENOUS

## 2018-05-30 ENCOUNTER — Encounter: Payer: Self-pay | Admitting: *Deleted

## 2018-05-30 ENCOUNTER — Ambulatory Visit: Payer: Managed Care, Other (non HMO) | Admitting: Certified Registered"

## 2018-05-30 ENCOUNTER — Ambulatory Visit: Payer: Managed Care, Other (non HMO)

## 2018-05-30 ENCOUNTER — Encounter
Admission: RE | Disposition: A | Discharge status: Home / Self Care | Payer: Self-pay | Source: Ambulatory Visit | Attending: Surgery

## 2018-05-30 ENCOUNTER — Ambulatory Visit
Admission: RE | Admit: 2018-05-30 | Discharge: 2018-05-30 | Disposition: A | Discharge status: Home / Self Care | Payer: Managed Care, Other (non HMO) | Source: Ambulatory Visit | Attending: Surgery | Admitting: Surgery

## 2018-05-30 ENCOUNTER — Other Ambulatory Visit: Payer: Self-pay

## 2018-05-30 DIAGNOSIS — K8020 Calculus of gallbladder without cholecystitis without obstruction: Secondary | ICD-10-CM

## 2018-05-30 DIAGNOSIS — K811 Chronic cholecystitis: Secondary | ICD-10-CM | POA: Insufficient documentation

## 2018-05-30 DIAGNOSIS — K8050 Calculus of bile duct without cholangitis or cholecystitis without obstruction: Secondary | ICD-10-CM

## 2018-05-30 DIAGNOSIS — R945 Abnormal results of liver function studies: Secondary | ICD-10-CM

## 2018-05-30 DIAGNOSIS — R7989 Other specified abnormal findings of blood chemistry: Secondary | ICD-10-CM

## 2018-05-30 DIAGNOSIS — Z419 Encounter for procedure for purposes other than remedying health state, unspecified: Secondary | ICD-10-CM

## 2018-05-30 DIAGNOSIS — R109 Unspecified abdominal pain: Secondary | ICD-10-CM | POA: Diagnosis present

## 2018-05-30 HISTORY — PX: CHOLECYSTECTOMY: SHX55

## 2018-05-30 SURGERY — LAPAROSCOPIC CHOLECYSTECTOMY
Anesthesia: General

## 2018-05-30 MED ORDER — DEXAMETHASONE SODIUM PHOSPHATE 10 MG/ML IJ SOLN
INTRAMUSCULAR | Status: DC | PRN
  Administered 2018-05-30: 10 mg via INTRAVENOUS

## 2018-05-30 MED ORDER — PROPOFOL 10 MG/ML IV BOLUS
INTRAVENOUS | Status: DC | PRN
  Administered 2018-05-30: 40 mg via INTRAVENOUS
  Administered 2018-05-30: 160 mg via INTRAVENOUS

## 2018-05-30 MED ORDER — SUGAMMADEX SODIUM 200 MG/2ML IV SOLN
INTRAVENOUS | Status: DC | PRN
  Administered 2018-05-30: 200 mg via INTRAVENOUS

## 2018-05-30 MED ORDER — LIDOCAINE HCL 1 % IJ SOLN
INTRAMUSCULAR | Status: DC | PRN
  Administered 2018-05-30: 20 mL via INTRAMUSCULAR

## 2018-05-30 MED ORDER — CEFAZOLIN SODIUM-DEXTROSE 2-4 GM/100ML-% IV SOLN
INTRAVENOUS | Status: AC
  Filled 2018-05-30: qty 100

## 2018-05-30 MED ORDER — FENTANYL CITRATE (PF) 100 MCG/2ML IJ SOLN
INTRAMUSCULAR | Status: DC | PRN
  Administered 2018-05-30: 50 ug via INTRAVENOUS
  Administered 2018-05-30 (×2): 100 ug via INTRAVENOUS

## 2018-05-30 MED ORDER — EPHEDRINE SULFATE 50 MG/ML IJ SOLN
INTRAMUSCULAR | Status: DC | PRN
  Administered 2018-05-30: 10 mg via INTRAVENOUS

## 2018-05-30 MED ORDER — LIDOCAINE HCL (PF) 1 % IJ SOLN
INTRAMUSCULAR | Status: AC
  Filled 2018-05-30: qty 30

## 2018-05-30 MED ORDER — OXYCODONE-ACETAMINOPHEN 5-325 MG PO TABS: 1.0000 | ORAL_TABLET | ORAL | 0 refills | Status: DC | PRN

## 2018-05-30 MED ORDER — HYDROMORPHONE HCL 1 MG/ML IJ SOLN: 0.2500 mg | INTRAMUSCULAR | Status: DC | PRN

## 2018-05-30 MED ORDER — MEPERIDINE HCL 50 MG/ML IJ SOLN: 6.2500 mg | INTRAMUSCULAR | Status: DC | PRN

## 2018-05-30 MED ORDER — MIDAZOLAM HCL 2 MG/2ML IJ SOLN
INTRAMUSCULAR | Status: DC | PRN
  Administered 2018-05-30: 2 mg via INTRAVENOUS

## 2018-05-30 MED ORDER — FAMOTIDINE 20 MG PO TABS
20.0000 mg | ORAL_TABLET | Freq: Once | ORAL | Status: AC
  Administered 2018-05-30: 20 mg via ORAL

## 2018-05-30 MED ORDER — PROPOFOL 10 MG/ML IV BOLUS
INTRAVENOUS | Status: AC
  Filled 2018-05-30: qty 20

## 2018-05-30 MED ORDER — ACETAMINOPHEN 160 MG/5ML PO SOLN
325.0000 mg | ORAL | Status: DC | PRN
  Filled 2018-05-30: qty 20.3

## 2018-05-30 MED ORDER — HYDROCODONE-ACETAMINOPHEN 7.5-325 MG PO TABS
1.0000 | ORAL_TABLET | Freq: Once | ORAL | Status: AC | PRN
  Administered 2018-05-30: 1 via ORAL
  Filled 2018-05-30: qty 1

## 2018-05-30 MED ORDER — ROCURONIUM BROMIDE 100 MG/10ML IV SOLN
INTRAVENOUS | Status: DC | PRN
  Administered 2018-05-30: 50 mg via INTRAVENOUS

## 2018-05-30 MED ORDER — CHLORHEXIDINE GLUCONATE CLOTH 2 % EX PADS: 6.0000 | MEDICATED_PAD | Freq: Once | CUTANEOUS | Status: DC

## 2018-05-30 MED ORDER — LACTATED RINGERS IV SOLN
INTRAVENOUS | Status: DC
  Administered 2018-05-30: 07:00:00 via INTRAVENOUS

## 2018-05-30 MED ORDER — ACETAMINOPHEN 500 MG PO TABS
ORAL_TABLET | ORAL | Status: AC
  Filled 2018-05-30: qty 2

## 2018-05-30 MED ORDER — FAMOTIDINE 20 MG PO TABS
ORAL_TABLET | ORAL | Status: AC
  Filled 2018-05-30: qty 1

## 2018-05-30 MED ORDER — LIDOCAINE HCL (CARDIAC) PF 100 MG/5ML IV SOSY
PREFILLED_SYRINGE | INTRAVENOUS | Status: DC | PRN
  Administered 2018-05-30: 50 mg via INTRAVENOUS

## 2018-05-30 MED ORDER — PROMETHAZINE HCL 25 MG/ML IJ SOLN: 6.2500 mg | INTRAMUSCULAR | Status: DC | PRN

## 2018-05-30 MED ORDER — ACETAMINOPHEN 325 MG PO TABS: 325.0000 mg | ORAL_TABLET | ORAL | Status: DC | PRN

## 2018-05-30 MED ORDER — ACETAMINOPHEN 500 MG PO TABS
1000.0000 mg | ORAL_TABLET | ORAL | Status: AC
  Administered 2018-05-30: 1000 mg via ORAL

## 2018-05-30 MED ORDER — BUPIVACAINE HCL (PF) 0.5 % IJ SOLN
INTRAMUSCULAR | Status: AC
  Filled 2018-05-30: qty 30

## 2018-05-30 MED ORDER — HYDROCODONE-ACETAMINOPHEN 7.5-325 MG PO TABS
ORAL_TABLET | ORAL | Status: AC
  Filled 2018-05-30: qty 1

## 2018-05-30 MED ORDER — ONDANSETRON HCL 4 MG/2ML IJ SOLN
INTRAMUSCULAR | Status: DC | PRN
  Administered 2018-05-30: 4 mg via INTRAVENOUS

## 2018-05-30 MED ORDER — FENTANYL CITRATE (PF) 250 MCG/5ML IJ SOLN
INTRAMUSCULAR | Status: AC
  Filled 2018-05-30: qty 5

## 2018-05-30 MED ORDER — MIDAZOLAM HCL 2 MG/2ML IJ SOLN
INTRAMUSCULAR | Status: AC
  Filled 2018-05-30: qty 2

## 2018-05-30 SURGICAL SUPPLY — 39 items
APPLIER CLIP ROT 10 11.4 M/L (STAPLE) ×2
CATH REDDICK CHOLANGI 4FR 50CM (CATHETERS) IMPLANT
CHLORAPREP W/TINT 26ML (MISCELLANEOUS) ×2 IMPLANT
CLIP APPLIE ROT 10 11.4 M/L (STAPLE) ×1 IMPLANT
COVER WAND RF STERILE (DRAPES) IMPLANT
DECANTER SPIKE VIAL GLASS SM (MISCELLANEOUS) IMPLANT
DERMABOND ADVANCED (GAUZE/BANDAGES/DRESSINGS) ×1
DERMABOND ADVANCED .7 DNX12 (GAUZE/BANDAGES/DRESSINGS) ×1 IMPLANT
DRAPE SHEET LG 3/4 BI-LAMINATE (DRAPES) ×2 IMPLANT
DRESSING SURGICEL FIBRLLR 1X2 (HEMOSTASIS) IMPLANT
DRSG SURGICEL FIBRILLAR 1X2 (HEMOSTASIS)
ELECT REM PT RETURN 9FT ADLT (ELECTROSURGICAL) ×2
ELECTRODE REM PT RTRN 9FT ADLT (ELECTROSURGICAL) ×1 IMPLANT
GLOVE BIO SURGEON STRL SZ7 (GLOVE) ×6 IMPLANT
GLOVE BIOGEL PI IND STRL 7.5 (GLOVE) ×3 IMPLANT
GLOVE BIOGEL PI INDICATOR 7.5 (GLOVE) ×3
GOWN STRL REUS W/ TWL LRG LVL3 (GOWN DISPOSABLE) ×3 IMPLANT
GOWN STRL REUS W/TWL LRG LVL3 (GOWN DISPOSABLE) ×3
GRASPER SUT TROCAR 14GX15 (MISCELLANEOUS) ×2 IMPLANT
IRRIGATION STRYKERFLOW (MISCELLANEOUS) IMPLANT
IRRIGATOR STRYKERFLOW (MISCELLANEOUS)
IV NS 1000ML (IV SOLUTION)
IV NS 1000ML BAXH (IV SOLUTION) IMPLANT
KIT TURNOVER KIT A (KITS) ×2 IMPLANT
NEEDLE HYPO 22GX1.5 SAFETY (NEEDLE) ×2 IMPLANT
NEEDLE INSUFFLATION 14GA 120MM (NEEDLE) ×2 IMPLANT
NS IRRIG 1000ML POUR BTL (IV SOLUTION) ×2 IMPLANT
PACK LAP CHOLECYSTECTOMY (MISCELLANEOUS) ×2 IMPLANT
PORT ACCESS TROCAR AIRSEAL 12 (TROCAR) ×1 IMPLANT
PORT ACCESS TROCAR AIRSEAL 5M (TROCAR) ×1
POUCH SPECIMEN RETRIEVAL 10MM (ENDOMECHANICALS) ×2 IMPLANT
SCISSORS METZENBAUM CVD 33 (INSTRUMENTS) ×2 IMPLANT
SET TRI-LUMEN FLTR TB AIRSEAL (TUBING) ×2 IMPLANT
SLEEVE ENDOPATH XCEL 5M (ENDOMECHANICALS) ×4 IMPLANT
SUT MNCRL AB 4-0 PS2 18 (SUTURE) ×2 IMPLANT
SUT VICRYL 0 UR6 27IN ABS (SUTURE) ×2 IMPLANT
SUT VICRYL AB 3-0 FS1 BRD 27IN (SUTURE) ×2 IMPLANT
SYR 20CC LL (SYRINGE) ×2 IMPLANT
TROCAR XCEL NON-BLD 5MMX100MML (ENDOMECHANICALS) ×2 IMPLANT

## 2018-05-30 NOTE — Anesthesia Post-op Follow-up Note (Signed)
Anesthesia QCDR form completed.        

## 2018-05-30 NOTE — Interval H&P Note (Signed)
History and Physical Interval Note:  05/30/2018 7:19 AM  Andrew Villanueva  has presented today for surgery, with the diagnosis of gallstones  The various methods of treatment have been discussed with the patient and family. After consideration of risks, benefits and other options for treatment, the patient has consented to  Procedure(s): LAPAROSCOPIC CHOLECYSTECTOMY WITH INTRAOPERATIVE CHOLANGIOGRAM (N/A) as a surgical intervention .  The patient's history has been reviewed, patient examined, no change in status, stable for surgery.  I have reviewed the patient's chart and labs.  Questions were answered to the patient's satisfaction.     Vickie Epley

## 2018-05-30 NOTE — Discharge Instructions (Addendum)
In addition to included general post-operative instructions for Laparoscopic Cholecystectomy,  Diet: Resume home heart healthy diet (may prefer to start with low fat foods as discussed).   Activity: No heavy lifting >20 pounds (children, pets, laundry, garbage) or strenuous activity until follow-up, but light activity and walking are encouraged. Do not drive or drink alcohol if taking narcotic pain medications.  Wound care: 2 days after surgery (Friday, 10/25), you may shower/get incision wet with soapy water and pat dry (do not rub incisions), but no baths or submerging incision underwater until follow-up.   Medications: Resume all home medications. For mild to moderate pain: acetaminophen (Tylenol) or ibuprofen/naproxen (if no kidney disease). Combining Tylenol with alcohol can substantially increase your risk of causing liver disease. Narcotic pain medications, if prescribed, can be used for severe pain, though may cause nausea, constipation, and drowsiness. Do not combine Tylenol and Percocet (or similar) within a 6 hour period as Percocet (and similar) contain(s) Tylenol. If you do not need the narcotic pain medication, you do not need to fill the prescription.  Call office [phone]) at any time if any questions, worsening pain, fevers/chills, bleeding, drainage from incision site, or other concerns.  AMBULATORY SURGERY  DISCHARGE INSTRUCTIONS   1) The drugs that you were given will stay in your system until tomorrow so for the next 24 hours you should not:  A) Drive an automobile B) Make any legal decisions C) Drink any alcoholic beverage   2) You may resume regular meals tomorrow.  Today it is better to start with liquids and gradually work up to solid foods.  You may eat anything you prefer, but it is better to start with liquids, then soup and crackers, and gradually work up to solid foods.   3) Please notify your doctor immediately if you have any unusual bleeding, trouble  breathing, redness and pain at the surgery site, drainage, fever, or pain not relieved by medication.    4) Additional Instructions:        Please contact your physician with any problems or Same Day Surgery at [phone], Monday through Friday 6 am to 4 pm, or [place] at [facility] number at [phone].

## 2018-05-30 NOTE — Transfer of Care (Signed)
Immediate Anesthesia Transfer of Care Note  Patient: Andrew Villanueva  Procedure(s) Performed: LAPAROSCOPIC CHOLECYSTECTOMY (N/A )  Patient Location: PACU  Anesthesia Type:General  Level of Consciousness: sedated  Airway & Oxygen Therapy: Patient Spontanous Breathing and Patient connected to face mask oxygen  Post-op Assessment: Report given to RN and Post -op Vital signs reviewed and stable  Post vital signs: Reviewed and stable  Last Vitals:  Vitals Value Taken Time  BP 144/79 05/30/2018  9:20 AM  Temp    Pulse 62 05/30/2018  9:20 AM  Resp 18 05/30/2018  9:20 AM  SpO2 100 % 05/30/2018  9:20 AM  Vitals shown include unvalidated device data.  Last Pain:  Vitals:   05/30/18 0612  TempSrc: Temporal  PainSc: 0-No pain         Complications: No apparent anesthesia complications

## 2018-05-30 NOTE — Anesthesia Procedure Notes (Signed)
Procedure Name: Intubation Date/Time: 05/30/2018 7:37 AM Performed by: Philbert Riser, CRNA Pre-anesthesia Checklist: Patient identified, Emergency Drugs available, Suction available, Patient being monitored and Timeout performed Patient Re-evaluated:Patient Re-evaluated prior to induction Oxygen Delivery Method: Circle system utilized and Simple face mask Preoxygenation: Pre-oxygenation with 100% oxygen Induction Type: IV induction Ventilation: Mask ventilation without difficulty Laryngoscope Size: Mac and 3 Grade View: Grade III Tube type: Oral Tube size: 7.5 mm Number of attempts: 1 Airway Equipment and Method: Stylet Placement Confirmation: ETT inserted through vocal cords under direct vision,  positive ETCO2 and breath sounds checked- equal and bilateral Secured at: 22 cm Tube secured with: Tape

## 2018-05-30 NOTE — Op Note (Signed)
SURGICAL OPERATIVE REPORT   DATE OF PROCEDURE: 05/30/2018  ATTENDING Surgeon(s): Vickie Epley, MD  ASSISTANT(S): Chapman Moss, PA-S   ANESTHESIA: GETA  PRE-OPERATIVE DIAGNOSIS: Choledocholithiasis (K80.50)  POST-OPERATIVE DIAGNOSIS: Choledocholithiasis (K80.50)  PROCEDURE(S): (cpt's: 22025) 1.) Laparoscopic Cholecystectomy  INTRAOPERATIVE FINDINGS: Mild pericholecystic inflammation with cystic duct and cystic artery clips well-secured, hemostasis at completion of procedure  INTRAOPERATIVE FLUIDS: 800 mL crystalloid   ESTIMATED BLOOD LOSS: Minimal (<30 mL)   URINE OUTPUT: No foley  SPECIMENS: Gallbladder  IMPLANTS: None  DRAINS: None   COMPLICATIONS: None apparent   CONDITION AT COMPLETION: Hemodynamically stable and extubated  DISPOSITION: PACU   INDICATION(S) FOR PROCEDURE:  Patient is a 67 y.o. male who recently presented to [facility] ED with post-prandial RUQ > epigastric abdominal pain after eating fatty foods, barbecued meats in particular. Ultrasound suggested cholelithiasis without sonographic evidence of cholecystitis. Patient's bilirubin was found to be elevated, for which he was advised to follow-up with his primary care physician, who repeated patient's CMP and again found his bilirubin to be elevated, and patient was referred for outpatient surgical evaluation. All risks, benefits, and alternatives to above elective procedures were discussed with the patient, who elected to proceed, and informed consent was accordingly obtained at that time.   DETAILS OF PROCEDURE:  Patient was brought to the operating suite and appropriately identified. General anesthesia was administered along with peri-operative prophylactic IV antibiotics, and endotracheal intubation was performed by anesthesiologist, along with NG/OG tube for gastric decompression. In supine position, operative site was prepped and draped in usual sterile fashion, and following a brief time out, initial 5  mm incision was made in a natural skin crease just above the umbilicus. Fascia was then elevated, and a Verress needle was inserted and its proper position confirmed using aspiration and saline meniscus test.  Upon insufflation of the abdominal cavity with carbon dioxide to a well-tolerated pressure of 12-15 mmHg, 5 mm peri-umbilical port followed by laparoscope were inserted and used to inspect the abdominal cavity and its contents with no injuries from insertion of the first trochar noted. Three additional trocars were inserted, one at the epigastric position (10 mm) and two along the Right costal margin (5 mm). The table was then placed in reverse Trendelenburg position with the Right side up. Filmy adhesions between the gallbladder and omentum/duodenum/transverse colon were lysed using combined blunt dissection and selective electrocautery. The apex/dome of the gallbladder was grasped with an atraumatic grasper passed through the lateral port and retracted apically over the liver. The infundibulum was also grasped and retracted, exposing Calot's triangle. The peritoneum overlying the gallbladder infundibulum was incised and dissected free of surrounding peritoneal attachments, revealing the cystic duct and cystic artery. A single clip was placed on the gallbladder specimen side of the clearly identified cystic duct, and cystic ductotomy was made using endoshear scissors. However a small amount of brisk pulsatile bleeding was obtained from the tiny arteriole running along the cystic duct, for which 2 additional clips were applied to the patient side of the cystic duct, which was transected and its identification confirmed as cystic duct. Cholangiogram was aborted accordingly, and the gallbladder was dissected from its peritoneal attachments to the liver using electrocautery. The gallbladder was placed into a laparoscopic specimen bag and removed from the abdominal cavity via the epigastric port site. Hemostasis  and secure placement of clips were confirmed, and intra-peritoneal cavity was inspected with no additional findings. PMI laparoscopic fascial closure device was then used to re-approximate fascia at the 10  mm epigastric port site.  All ports were then removed under direct visualization, and abdominal cavity was desuflated. All port sites were irrigated/cleaned, additional local anesthetic was injected at each incision, 3-0 Vicryl was used to re-approximate dermis at 10 mm port site(s), and subcuticular 4-0 Monocryl suture was used to re-approximate skin. Skin was then cleaned, dried, and sterile skin glue was applied. Patient was then safely able to be awakened, extubated, and transferred to PACU for post-operative monitoring and care.   I was present for all aspects of the above procedure, and no operative complications were apparent.

## 2018-05-30 NOTE — Anesthesia Postprocedure Evaluation (Signed)
Anesthesia Post Note  Patient: Andrew Villanueva  Procedure(s) Performed: LAPAROSCOPIC CHOLECYSTECTOMY (N/A )  Patient location during evaluation: PACU Anesthesia Type: General Level of consciousness: awake and alert Pain management: pain level controlled Vital Signs Assessment: post-procedure vital signs reviewed and stable Respiratory status: spontaneous breathing, nonlabored ventilation and respiratory function stable Cardiovascular status: blood pressure returned to baseline and stable Postop Assessment: no apparent nausea or vomiting Anesthetic complications: no     Last Vitals:  Vitals:   05/30/18 1012 05/30/18 1024  BP: 129/72 (!) 146/78  Pulse:    Resp:    Temp:    SpO2: 100%     Last Pain:  Vitals:   05/30/18 1102  TempSrc:   PainSc: 3                  [name]

## 2018-05-30 NOTE — OR Nursing (Signed)
Discussed discharge instructions with pt, wife and daughter. All voice understanding.

## 2018-05-30 NOTE — Anesthesia Preprocedure Evaluation (Signed)
Anesthesia Evaluation  Patient identified by MRN, date of birth, ID band Patient awake    Reviewed: Allergy & Precautions, H&P , NPO status , reviewed documented beta blocker date and time   History of Anesthesia Complications (+) history of anesthetic complications  Airway Mallampati: II  TM Distance: >3 FB Neck ROM: full    Dental  (+) Partial Upper, Chipped   Pulmonary    breath sounds clear to auscultation       Cardiovascular Normal cardiovascular exam     Neuro/Psych    GI/Hepatic GERD  Controlled,  Endo/Other    Renal/GU      Musculoskeletal  (+) Arthritis ,   Abdominal   Peds  Hematology   Anesthesia Other Findings Past Medical History: No date: Arthritis No date: Complication of anesthesia     Comment:  1ST COLONOSCOPY PT HAD TROUBLE WAKING UP  No date: GERD (gastroesophageal reflux disease)     Comment:  OCC-TUMS PRN No date: History of measles No date: Wears dentures     Comment:  upper partial Past Surgical History: No date: COLONOSCOPY 01/23/2017: COLONOSCOPY WITH PROPOFOL; N/A     Comment:  Procedure: COLONOSCOPY WITH PROPOFOL;  Surgeon: Lucilla Lame, MD;  Location: [place];  Service:               Endoscopy;  Laterality: N/A; 01/23/2017: POLYPECTOMY     Comment:  Procedure: POLYPECTOMY;  Surgeon: Lucilla Lame, MD;                Location: [place];  Service: Endoscopy;; BMI    Body Mass Index:  30.22 kg/m     Reproductive/Obstetrics                             Anesthesia Physical Anesthesia Plan  ASA: II  Anesthesia Plan: General   Post-op Pain Management:    Induction: Intravenous  PONV Risk Score and Plan: 3 and Ondansetron, Treatment may vary due to age or medical condition and Midazolam  Airway Management Planned: Oral ETT  Additional Equipment:   Intra-op Plan:   Post-operative Plan: Extubation in  OR  Informed Consent: I have reviewed the patients History and Physical, chart, labs and discussed the procedure including the risks, benefits and alternatives for the proposed anesthesia with the patient or authorized representative who has indicated his/her understanding and acceptance.   Dental Advisory Given  Plan Discussed with: CRNA  Anesthesia Plan Comments:         Anesthesia Quick Evaluation

## 2018-06-01 DIAGNOSIS — K8050 Calculus of bile duct without cholangitis or cholecystitis without obstruction: Secondary | ICD-10-CM

## 2018-06-01 LAB — SURGICAL PATHOLOGY

## 2018-06-14 ENCOUNTER — Ambulatory Visit (INDEPENDENT_AMBULATORY_CARE_PROVIDER_SITE_OTHER): Payer: Managed Care, Other (non HMO) | Admitting: Surgery

## 2018-06-14 ENCOUNTER — Encounter: Payer: Self-pay | Admitting: Surgery

## 2018-06-14 ENCOUNTER — Other Ambulatory Visit: Payer: Self-pay

## 2018-06-14 VITALS — BP 151/91 | HR 74 | Temp 97.7°F | Resp 16 | Ht 73.0 in | Wt 219.0 lb

## 2018-06-14 DIAGNOSIS — K8050 Calculus of bile duct without cholangitis or cholecystitis without obstruction: Secondary | ICD-10-CM

## 2018-06-14 DIAGNOSIS — K8020 Calculus of gallbladder without cholecystitis without obstruction: Secondary | ICD-10-CM

## 2018-06-14 DIAGNOSIS — Z4889 Encounter for other specified surgical aftercare: Secondary | ICD-10-CM

## 2018-06-14 NOTE — Progress Notes (Signed)
Surgical Clinic Progress/Follow-up Note   HPI:  67 y.o. Male presents to clinic for post-op follow-up 15 Days s/p laparoscopic cholecystectomy Rosana Hoes, 05/30/2018). Patient reports complete resolution of peri-operative pain and has been tolerating regular diet with +flatus and normal BM's, denies N/V, fever/chills, CP, or SOB, though he continues to reduce the fried and fatty foods he eats both for his overall health and to minimize concern for loose BM's.  Review of Systems:  Constitutional: denies fever/chills  Respiratory: denies shortness of breath, wheezing  Cardiovascular: denies chest pain, palpitations  Gastrointestinal: abdominal pain, N/V, and bowel function as per interval history Skin: Denies any other rashes or skin discolorations except post-surgical wounds as per interval history  Vital Signs:  BP (!) 151/91   Pulse 74   Temp 97.7 F (36.5 C) (Skin)   Resp 16   Ht 6\' 1"  (1.854 m)   Wt 219 lb (99.3 kg)   SpO2 97%   BMI 28.89 kg/m    Physical Exam:  Constitutional:  -- Normal body habitus  -- Awake, alert, and oriented x3  Pulmonary:  -- No crackles -- Equal breath sounds bilaterally -- Breathing non-labored at rest Cardiovascular:  -- S1, S2 present  -- No pericardial rubs  Gastrointestinal:  -- Soft and non-distended, completely non-tender to palpation, no guarding/rebound tenderness -- Post-surgical incisions all well-approximated without any peri-incisional erythema or drainage -- No abdominal masses appreciated, pulsatile or otherwise  Musculoskeletal / Integumentary:  -- Wounds or skin discoloration: None appreciated except post-surgical incisions as described above (GI) -- Extremities: B/L UE and LE FROM, hands and feet warm, no edema   Imaging: No new pertinent imaging available for review  Assessment:  68 y.o. yo Male with a problem list including...  Patient Active Problem List   Diagnosis Date Noted  . Calculus of bile duct without  cholangitis or biliary obstruction   . Hepatic steatosis 05/10/2018  . Calculus of gallbladder without cholecystitis without obstruction 05/09/2018  . Left inguinal hernia 05/09/2018  . Trigger thumb 05/09/2018  . Special screening for malignant neoplasms, colon   . Benign neoplasm of ascending colon   . Benign neoplasm of transverse colon   . Benign neoplasm of descending colon   . Obesity 11/02/2015  . Elevated blood pressure 11/02/2015  . 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 Family history of cardiovascular disease 10/25/2006    presents to clinic for post-op follow-up evaluation, doing very well 15 Days s/p laparoscopic cholecystectomy Rosana Hoes, 05/30/2018) for recent resolved choledocholithiasis.  Plan:              - advance diet as tolerated              - okay to submerge incisions under water (baths, swimming) prn             - gradually resume all activities without restrictions over next 2 weeks             - apply sunblock particularly to incisions with sun exposure to reduce pigmentation of scars             - return to clinic as needed, instructed to call office if any questions or concerns  All of the above recommendations were discussed with the patient, and all of patient's questions were answered to his expressed satisfaction.  -- Marilynne Drivers Rosana Hoes, MD, [place]: [facility] - Partnering for exceptional care. Office: [phone]

## 2018-06-14 NOTE — Patient Instructions (Addendum)
The patient is aware to call back for any questions or new concerns.  Resume activities as tolerated.

## 2018-06-15 ENCOUNTER — Ambulatory Visit: Payer: Self-pay

## 2018-06-15 DIAGNOSIS — Z299 Encounter for prophylactic measures, unspecified: Secondary | ICD-10-CM

## 2018-08-02 ENCOUNTER — Encounter: Payer: Self-pay | Admitting: Emergency Medicine

## 2018-08-02 ENCOUNTER — Ambulatory Visit: Payer: Self-pay | Admitting: Emergency Medicine

## 2018-08-02 VITALS — BP 140/86 | HR 73 | Temp 98.6°F | Resp 14

## 2018-08-02 DIAGNOSIS — J209 Acute bronchitis, unspecified: Secondary | ICD-10-CM

## 2018-08-02 DIAGNOSIS — J0100 Acute maxillary sinusitis, unspecified: Secondary | ICD-10-CM

## 2018-08-02 MED ORDER — AMOXICILLIN 875 MG PO TABS: ORAL_TABLET | ORAL | 0 refills | Status: DC

## 2018-08-02 MED ORDER — BENZONATATE 200 MG PO CAPS: ORAL_CAPSULE | ORAL | 0 refills | Status: DC

## 2018-08-02 NOTE — Progress Notes (Signed)
[facility]   Patient ID: Andrew Villanueva DOB: 67 y.o. MRN: [id number]  SINUSITIS  Onset: 8 days Facial/sinus pressure with discolored nasal mucus.  Cough, productive of discolored sputum  Severity: moderate Tried OTC meds without significant relief.  Symptoms:  + Fever  + URI prodrome with nasal congestion + Minimal swollen neck glands + mild Sinus Headache + mild ear pressure  No Allergy symptoms No significant Sore Throat No eye symptoms     No chest pain No shortness of breath  No wheezing  No Abdominal Pain No Nausea No Vomiting No diarrhea  No Myalgias No focal neurologic symptoms No syncope No Rash  No Urinary symptoms  Remainder of Review of Systems negative except as noted in the HPI.    Physical Exam: Blood pressure 140/86, pulse 73, temperature 98.6 F (37 C), temperature source Oral, resp. rate 14, SpO2 95 %. Constitutional: Oriented to person, place, and time. Appears well-developed and well-nourished. No distress.  HENT:  Head: Normocephalic and atraumatic.  Right Ear: TM, external ear and ear canal normal.  Left Ear: TM, external ear and ear canal normal.  Nose: Mucosal edema and rhinorrhea present.  Right: maxillary sinus tenderness.   Left: maxillary sinus tenderness.  Mouth/Throat: Oropharynx clear and moist. No oral lesions. No oropharyngeal exudate.  Eyes: Right eye exhibits no discharge. Left eye exhibits no discharge. No scleral icterus.  Neck: Neck supple. No adenopathy. Cardiovascular: Normal rate, regular rhythm and normal heart sounds.  Pulmonary/Chest: Effort normal.  Occasional cough noted   Mild diffuse rhonchi.   No wheezes.    No rales.  Lymphadenopathy:  No cervical adenopathy.  Neurological: Alert and oriented to person, place, and time. Cranial nerves intact. Skin: Skin is warm and dry. No rash. Nursing note and vitals reviewed.    Assessment: Acute bronchitis Acute  Maxillary Sinusitis    Plan: Treatment options discussed, as well as risks, benefits, alternatives. Patient voiced understanding and agreement with the following plans: Given verbally and printed in AVS:  "You have sinusitis and bronchitis.  No sign of pneumonia. Sending prescriptions to your pharmacy for: Amoxicillin, which is an antibiotic Tessalon Perles, which are strong but not-narcotic cough medicine. Rest and drink plenty of fluids. May use over-the-counter Mucinex D as needed for congestion and expectorant. Tylenol or ibuprofen as needed for pain or fever. Follow-up with your doctor or here if not better in 1 week, sooner if worse or new symptoms.  Also, see instruction sheets on sinusitis and bronchitis, attached to this sheet."    New Prescriptions   AMOXICILLIN (AMOXIL) 875 MG TABLET    Take 1 twice a day X 10 days.   BENZONATATE (TESSALON) 200 MG CAPSULE    Take 1 every 8 hours as needed for cough.    Follow-up with your primary care doctor in 5-7 days if not improving, or sooner if symptoms become worse. Precautions discussed. Red flags discussed. Questions invited and answered. They voiced understanding and agreement.

## 2018-08-02 NOTE — Patient Instructions (Addendum)
You have sinusitis and bronchitis.  No sign of pneumonia. Sending prescriptions to your pharmacy for: Amoxicillin, which is an antibiotic Tessalon Perles, which are strong but not-narcotic cough medicine. Rest and drink plenty of fluids. May use over-the-counter Mucinex D as needed for congestion and expectorant. Tylenol or ibuprofen as needed for pain or fever. Follow-up with your doctor or here if not better in 1 week, sooner if worse or new symptoms.  Also, see instruction sheets on sinusitis and bronchitis, attached to this sheet. Acute Bronchitis, Adult Acute bronchitis is when air tubes (bronchi) in the lungs suddenly get swollen. The condition can make it hard to breathe. It can also cause these symptoms:  A cough.  Coughing up clear, yellow, or green mucus.  Wheezing.  Chest congestion.  Shortness of breath.  A fever.  Body aches.  Chills.  A sore throat. Follow these instructions at home:  Medicines  Take over-the-counter and prescription medicines only as told by your doctor.  If you were prescribed an antibiotic medicine, take it as told by your doctor. Do not stop taking the antibiotic even if you start to feel better. General instructions  Rest.  Drink enough fluids to keep your pee (urine) pale yellow.  Avoid smoking and secondhand smoke. If you smoke and you need help quitting, ask your doctor. Quitting will help your lungs heal faster.  Use an inhaler, cool mist vaporizer, or humidifier as told by your doctor.  Keep all follow-up visits as told by your doctor. This is important. How is this prevented? To lower your risk of getting this condition again:  Wash your hands often with soap and water. If you cannot use soap and water, use hand sanitizer.  Avoid contact with people who have cold symptoms.  Try not to touch your hands to your mouth, nose, or eyes.  Make sure to get the flu shot every year. Contact a doctor if:  Your symptoms do not  get better in 2 weeks. Get help right away if:  You cough up blood.  You have chest pain.  You have very bad shortness of breath.  You become dehydrated.  You faint (pass out) or keep feeling like you are going to pass out.  You keep throwing up (vomiting).  You have a very bad headache.  Your fever or chills gets worse. This information is not intended to replace advice given to you by your health care provider. Make sure you discuss any questions you have with your health care provider. Document Released: 01/11/2008 Document Revised: 03/08/2017 Document Reviewed: 01/13/2016 Elsevier Interactive Patient Education  2019 [employer]  Sinusitis, Adult Sinusitis is soreness and swelling (inflammation) of your sinuses. Sinuses are hollow spaces in the bones around your face. They are located:  Around your eyes.  In the middle of your forehead.  Behind your nose.  In your cheekbones. Your sinuses and nasal passages are lined with a fluid called mucus. Mucus drains out of your sinuses. Swelling can trap mucus in your sinuses. This lets germs (bacteria, virus, or fungus) grow, which leads to infection. Most of the time, this condition is caused by a virus. What are the causes? This condition is caused by:  Allergies.  Asthma.  Germs.  Things that block your nose or sinuses.  Growths in the nose (nasal polyps).  Chemicals or irritants in the air.  Fungus (rare). What increases the risk? You are more likely to develop this condition if:  You have a weak body  defense system (immune system).  You do a lot of swimming or diving.  You use nasal sprays too much.  You smoke. What are the signs or symptoms? The main symptoms of this condition are pain and a feeling of pressure around the sinuses. Other symptoms include:  Stuffy nose (congestion).  Runny nose (drainage).  Swelling and warmth in the sinuses.  Headache.  Toothache.  A cough that may get worse  at night.  Mucus that collects in the throat or the back of the nose (postnasal drip).  Being unable to smell and taste.  Being very tired (fatigue).  A fever.  Sore throat.  Bad breath. How is this diagnosed? This condition is diagnosed based on:  Your symptoms.  Your medical history.  A physical exam.  Tests to find out if your condition is short-term (acute) or long-term (chronic). Your doctor may: ? Check your nose for growths (polyps). ? Check your sinuses using a tool that has a light (endoscope). ? Check for allergies or germs. ? Do imaging tests, such as an MRI or CT scan. How is this treated? Treatment for this condition depends on the cause and whether it is short-term or long-term.  If caused by a virus, your symptoms should go away on their own within 10 days. You may be given medicines to relieve symptoms. They include: ? Medicines that shrink swollen tissue in the nose. ? Medicines that treat allergies (antihistamines). ? A spray that treats swelling of the nostrils. ? Rinses that help get rid of thick mucus in your nose (nasal saline washes).  If caused by bacteria, your doctor may wait to see if you will get better without treatment. You may be given antibiotic medicine if you have: ? A very bad infection. ? A weak body defense system.  If caused by growths in the nose, you may need to have surgery. Follow these instructions at home: Medicines  Take, use, or apply over-the-counter and prescription medicines only as told by your doctor. These may include nasal sprays.  If you were prescribed an antibiotic medicine, take it as told by your doctor. Do not stop taking the antibiotic even if you start to feel better. Hydrate and humidify   Drink enough water to keep your pee (urine) pale yellow.  Use a cool mist humidifier to keep the humidity level in your home above 50%.  Breathe in steam for 10-15 minutes, 3-4 times a day, or as told by your  doctor. You can do this in the bathroom while a hot shower is running.  Try not to spend time in cool or dry air. Rest  Rest as much as you can.  Sleep with your head raised (elevated).  Make sure you get enough sleep each night. General instructions   Put a warm, moist washcloth on your face 3-4 times a day, or as often as told by your doctor. This will help with discomfort.  Wash your hands often with soap and water. If there is no soap and water, use hand sanitizer.  Do not smoke. Avoid being around people who are smoking (secondhand smoke).  Keep all follow-up visits as told by your doctor. This is important. Contact a doctor if:  You have a fever.  Your symptoms get worse.  Your symptoms do not get better within 10 days. Get help right away if:  You have a very bad headache.  You cannot stop throwing up (vomiting).  You have very bad pain or swelling  around your face or eyes.  You have trouble seeing.  You feel confused.  Your neck is stiff.  You have trouble breathing. Summary  Sinusitis is swelling of your sinuses. Sinuses are hollow spaces in the bones around your face.  This condition is caused by tissues in your nose that become inflamed or swollen. This traps germs. These can lead to infection.  If you were prescribed an antibiotic medicine, take it as told by your doctor. Do not stop taking it even if you start to feel better.  Keep all follow-up visits as told by your doctor. This is important. This information is not intended to replace advice given to you by your health care provider. Make sure you discuss any questions you have with your health care provider. Document Released: 01/11/2008 Document Revised: 12/25/2017 Document Reviewed: 12/25/2017 Elsevier Interactive Patient Education  2019 [employer].

## 2018-09-21 ENCOUNTER — Other Ambulatory Visit: Payer: Self-pay

## 2018-09-21 ENCOUNTER — Ambulatory Visit: Payer: Self-pay | Admitting: Adult Health

## 2018-09-21 VITALS — BP 150/82 | HR 64 | Temp 97.6°F | Resp 14

## 2018-09-21 DIAGNOSIS — H8113 Benign paroxysmal vertigo, bilateral: Secondary | ICD-10-CM | POA: Insufficient documentation

## 2018-09-21 DIAGNOSIS — H6982 Other specified disorders of Eustachian tube, left ear: Secondary | ICD-10-CM | POA: Insufficient documentation

## 2018-09-21 DIAGNOSIS — H6502 Acute serous otitis media, left ear: Secondary | ICD-10-CM

## 2018-09-21 LAB — POCT URINALYSIS DIPSTICK
Bilirubin, UA: NEGATIVE
Blood, UA: NEGATIVE
Glucose, UA: NEGATIVE
KETONES UA: NEGATIVE
Leukocytes, UA: NEGATIVE
NITRITE UA: NEGATIVE
PROTEIN UA: NEGATIVE
Spec Grav, UA: <=1.005 — AB (ref 1.010–1.025)
Urobilinogen, UA: 0.2 E.U./dL
pH, UA: 5 (ref 5.0–8.0)

## 2018-09-21 MED ORDER — PREDNISONE 10 MG (21) PO TBPK: ORAL_TABLET | ORAL | 0 refills | Status: DC

## 2018-09-21 MED ORDER — AMOXICILLIN-POT CLAVULANATE 875-125 MG PO TABS: 1.0000 | ORAL_TABLET | Freq: Two times a day (BID) | ORAL | 0 refills | Status: DC

## 2018-09-21 MED ORDER — FLUTICASONE PROPIONATE 50 MCG/ACT NA SUSP: 2.0000 | Freq: Every day | NASAL | 0 refills | Status: DC

## 2018-09-21 NOTE — Patient Instructions (Addendum)
Provider recommends Emergency room evaluation for orthostatic pressure changes - patient declines. Please see your primary care physician Birdie Sons, MD within 3 days from today's visit.   Advised patient call the office or your primary care doctor for an appointment if no improvement within 72 hours or if any symptoms change or worsen at any time  Advised ER or urgent Care if after hours or on weekend. Call 911 for emergency symptoms at any time.Patinet verbalized understanding of all instructions given/reviewed and treatment plan and has no further questions or concerns at this time.         Otitis Media, Adult  Otitis media means that the middle ear is red and swollen (inflamed) and full of fluid. The condition usually goes away on its own. Follow these instructions at home:  Take over-the-counter and prescription medicines only as told by your doctor.  If you were prescribed an antibiotic medicine, take it as told by your doctor. Do not stop taking the antibiotic even if you start to feel better.  Keep all follow-up visits as told by your doctor. This is important. Contact a doctor if:  You have bleeding from your nose.  There is a lump on your neck.  You are not getting better in 5 days.  You feel worse instead of better. Get help right away if:  You have pain that is not helped with medicine.  You have swelling, redness, or pain around your ear.  You get a stiff neck.  You cannot move part of your face (paralyzed).  You notice that the bone behind your ear hurts when you touch it.  You get a very bad headache. Summary  Otitis media means that the middle ear is red, swollen, and full of fluid.  This condition usually goes away on its own. In some cases, treatment may be needed.  If you were prescribed an antibiotic medicine, take it as told by your doctor. This information is not intended to replace advice given to you by your health care provider. Make  sure you discuss any questions you have with your health care provider. Document Released: 01/11/2008 Document Revised: 08/15/2016 Document Reviewed: 08/15/2016 Elsevier Interactive Patient Education  2019 [employer] Benign Positional Vertigo Vertigo is the feeling that you or your surroundings are moving when they are not. Benign positional vertigo is the most common form of vertigo. This is usually a harmless condition (benign). This condition is positional. This means that symptoms are triggered by certain movements and positions. This condition can be dangerous if it occurs while you are doing something that could cause harm to you or others. This includes activities such as driving or operating machinery. What are the causes? In many cases, the cause of this condition is not known. It may be caused by a disturbance in an area of the inner ear that helps your brain to sense movement and balance. This disturbance can be caused by:  Viral infection (labyrinthitis).  Head injury.  Repetitive motion, such as jumping, dancing, or running. What increases the risk? You are more likely to develop this condition if:  You are a woman.  You are 68 years of age or older. What are the signs or symptoms? Symptoms of this condition usually happen when you move your head or your eyes in different directions. Symptoms may start suddenly, and usually last for less than a minute. They include:  Loss of balance and falling.  Feeling like you are spinning or moving.  Feeling like your surroundings are spinning or moving.  Nausea and vomiting.  Blurred vision.  Dizziness.  Involuntary eye movement (nystagmus). Symptoms can be mild and cause only minor problems, or they can be severe and interfere with daily life. Episodes of benign positional vertigo may return (recur) over time. Symptoms may improve over time. How is this diagnosed? This condition may be diagnosed based on:  Your medical  history.  Physical exam of the head, neck, and ears.  Tests, such as: ? MRI. ? CT scan. ? Eye movement tests. Your health care provider may ask you to change positions quickly while he or she watches you for symptoms of benign positional vertigo, such as nystagmus. Eye movement may be tested with a variety of exams that are designed to evaluate or stimulate vertigo. ? An electroencephalogram (EEG). This records electrical activity in your brain. ? Hearing tests. You may be referred to a health care provider who specializes in ear, nose, and throat (ENT) problems (otolaryngologist) or a provider who specializes in disorders of the nervous system (neurologist). How is this treated?  This condition may be treated in a session in which your health care provider moves your head in specific positions to adjust your inner ear back to normal. Treatment for this condition may take several sessions. Surgery may be needed in severe cases, but this is rare. In some cases, benign positional vertigo may resolve on its own in 2-4 weeks. Follow these instructions at home: Safety  Move slowly. Avoid sudden body or head movements or certain positions, as told by your health care provider.  Avoid driving until your health care provider says it is safe for you to do so.  Avoid operating heavy machinery until your health care provider says it is safe for you to do so.  Avoid doing any tasks that would be dangerous to you or others if vertigo occurs.  If you have trouble walking or keeping your balance, try using a cane for stability. If you feel dizzy or unstable, sit down right away.  Return to your normal activities as told by your health care provider. Ask your health care provider what activities are safe for you. General instructions  Take over-the-counter and prescription medicines only as told by your health care provider.  Drink enough fluid to keep your urine pale yellow.  Keep all follow-up  visits as told by your health care provider. This is important. Contact a health care provider if:  You have a fever.  Your condition gets worse or you develop new symptoms.  Your family or friends notice any behavioral changes.  You have nausea or vomiting that gets worse.  You have numbness or a "pins and needles" sensation. Get help right away if you:  Have difficulty speaking or moving.  Are always dizzy.  Faint.  Develop severe headaches.  Have weakness in your legs or arms.  Have changes in your hearing or vision.  Develop a stiff neck.  Develop sensitivity to light. Summary  Vertigo is the feeling that you or your surroundings are moving when they are not. Benign positional vertigo is the most common form of vertigo.  The cause of this condition is not known. It may be caused by a disturbance in an area of the inner ear that helps your brain to sense movement and balance.  Symptoms include loss of balance and falling, feeling that you or your surroundings are moving, nausea and vomiting, and blurred vision.  This condition can  be diagnosed based on symptoms, physical exam, and other tests, such as MRI, CT scan, eye movement tests, and hearing tests.  Follow safety instructions as told by your health care provider. You will also be told when to contact your health care provider in case of problems. This information is not intended to replace advice given to you by your health care provider. Make sure you discuss any questions you have with your health care provider. Document Released: 05/02/2006 Document Revised: 01/03/2018 Document Reviewed: 01/03/2018 Elsevier Interactive Patient Education  2019 [employer]. Eustachian Tube Dysfunction  Eustachian tube dysfunction refers to a condition in which a blockage develops in the narrow passage that connects the middle ear to the back of the nose (eustachian tube). The eustachian tube regulates air pressure in the middle  ear by letting air move between the ear and nose. It also helps to drain fluid from the middle ear space. Eustachian tube dysfunction can affect one or both ears. When the eustachian tube does not function properly, air pressure, fluid, or both can build up in the middle ear. What are the causes? This condition occurs when the eustachian tube becomes blocked or cannot open normally. Common causes of this condition include:  Ear infections.  Colds and other infections that affect the nose, mouth, and throat (upper respiratory tract).  Allergies.  Irritation from cigarette smoke.  Irritation from stomach acid coming up into the esophagus (gastroesophageal reflux). The esophagus is the tube that carries food from the mouth to the stomach.  Sudden changes in air pressure, such as from descending in an airplane or scuba diving.  Abnormal growths in the nose or throat, such as: ? Growths that line the nose (nasal polyps). ? Abnormal growth of cells (tumors). ? Enlarged tissue at the back of the throat (adenoids). What increases the risk? You are more likely to develop this condition if:  You smoke.  You are overweight.  You are a child who has: ? Certain birth defects of the mouth, such as cleft palate. ? Large tonsils or adenoids. What are the signs or symptoms? Common symptoms of this condition include:  A feeling of fullness in the ear.  Ear pain.  Clicking or popping noises in the ear.  Ringing in the ear.  Hearing loss.  Loss of balance.  Dizziness. Symptoms may get worse when the air pressure around you changes, such as when you travel to an area of high elevation, fly on an airplane, or go scuba diving. How is this diagnosed? This condition may be diagnosed based on:  Your symptoms.  A physical exam of your ears, nose, and throat.  Tests, such as those that measure: ? The movement of your eardrum (tympanogram). ? Your hearing (audiometry). How is this  treated? Treatment depends on the cause and severity of your condition.  In mild cases, you may relieve your symptoms by moving air into your ears. This is called "popping the ears."  In more severe cases, or if you have symptoms of fluid in your ears, treatment may include: ? Medicines to relieve congestion (decongestants). ? Medicines that treat allergies (antihistamines). ? Nasal sprays or ear drops that contain medicines that reduce swelling (steroids). ? A procedure to drain the fluid in your eardrum (myringotomy). In this procedure, a small tube is placed in the eardrum to:  Drain the fluid.  Restore the air in the middle ear space. ? A procedure to insert a balloon device through the nose to inflate the  opening of the eustachian tube (balloon dilation). Follow these instructions at home: Lifestyle  Do not do any of the following until your health care provider approves: ? Travel to high altitudes. ? Fly in airplanes. ? Work in a [employer] scheme manager or room. ? Scuba dive.  Do not use any products that contain nicotine or tobacco, such as cigarettes and e-cigarettes. If you need help quitting, ask your health care provider.  Keep your ears dry. Wear fitted earplugs during showering and bathing. Dry your ears completely after. General instructions  Take over-the-counter and prescription medicines only as told by your health care provider.  Use techniques to help pop your ears as recommended by your health care provider. These may include: ? Chewing gum. ? Yawning. ? Frequent, forceful swallowing. ? Closing your mouth, holding your nose closed, and gently blowing as if you are trying to blow air out of your nose.  Keep all follow-up visits as told by your health care provider. This is important. Contact a health care provider if:  Your symptoms do not go away after treatment.  Your symptoms come back after treatment.  You are unable to pop your ears.  You have: ? A  fever. ? Pain in your ear. ? Pain in your head or neck. ? Fluid draining from your ear.  Your hearing suddenly changes.  You become very dizzy.  You lose your balance. Summary  Eustachian tube dysfunction refers to a condition in which a blockage develops in the eustachian tube.  It can be caused by ear infections, allergies, inhaled irritants, or abnormal growths in the nose or throat.  Symptoms include ear pain, hearing loss, or ringing in the ears.  Mild cases are treated with maneuvers to unblock the ears, such as yawning or ear popping.  Severe cases are treated with medicines. Surgery may also be done (rare). This information is not intended to replace advice given to you by your health care provider. Make sure you discuss any questions you have with your health care provider. Document Released: 08/21/2015 Document Revised: 11/14/2017 Document Reviewed: 11/14/2017 Elsevier Interactive Patient Education  2019 [employer]. Otitis Media, Adult  Otitis media means that the middle ear is red and swollen (inflamed) and full of fluid. The condition usually goes away on its own. Follow these instructions at home:  Take over-the-counter and prescription medicines only as told by your doctor.  If you were prescribed an antibiotic medicine, take it as told by your doctor. Do not stop taking the antibiotic even if you start to feel better.  Keep all follow-up visits as told by your doctor. This is important. Contact a doctor if:  You have bleeding from your nose.  There is a lump on your neck.  You are not getting better in 5 days.  You feel worse instead of better. Get help right away if:  You have pain that is not helped with medicine.  You have swelling, redness, or pain around your ear.  You get a stiff neck.  You cannot move part of your face (paralyzed).  You notice that the bone behind your ear hurts when you touch it.  You get a very bad  headache. Summary  Otitis media means that the middle ear is red, swollen, and full of fluid.  This condition usually goes away on its own. In some cases, treatment may be needed.  If you were prescribed an antibiotic medicine, take it as told by your doctor. This information is not  intended to replace advice given to you by your health care provider. Make sure you discuss any questions you have with your health care provider. Document Released: 01/11/2008 Document Revised: 08/15/2016 Document Reviewed: 08/15/2016 Elsevier Interactive Patient Education  2019 [employer].

## 2018-09-21 NOTE — Progress Notes (Addendum)
[facility] Subjective:     Patient ID: Andrew Villanueva, male   DOB: 1950/09/13, 68 y.o.   MRN: [id number]  HPI   Blood pressure (!) 150/82, pulse 64, temperature 97.6 F (36.4 C), temperature source Oral, resp. rate 14, SpO2 98 %. Patient is a 68 year old male in no acute distress who comes to the clinic in no acute distress who reports he has been  having " vertigo " type symptoms. He reports one episode over 4 years ago, and again maybe last year once. He  Was not seen for this - he reports he reported to his primary care at last annual.  Onset 1 week ago.   Feels as if with fast head movements he becomes dizzy. Feels as if room is spinning for " maybe around 5 second". He reports this has happened maybe three times since this morning. Occasional nausea with these episodes only.    He also reports that his left ear has been bothering him for over 1 week.  He was treated with amoxicillin for sinusitis in December. He denies any recent illnesses or hospitalizations. He denies any exposures to illness.   He reports he is drinking two 8 oz bottles per day. also drinking  Sweet tea as well and coffee.  He denies any changes in bowel habits and reports that he has a bowel movement every day.  Denies any visual disturbances.  Patient denies any blood in his stool.  He has bowel movement every day and denies any constipation.  He is urinating normally per his report. No vomiting.  No chest pain.  Denies any headaches.  He denies any episodes of falls, injuries or head trauma.  Denies any change in speech or function. He is working his job today without any difficulty.   Patient  denies any fever, body aches,chills, rash, chest pain, shortness of breath, nausea, vomiting, or diarrhea.   Last physical was in April 2019 with his primary care Dr. Caryn Section. Patient Active Problem List   Diagnosis Date Noted  . Hepatic steatosis 05/10/2018  . Left  inguinal hernia 05/09/2018  . Trigger thumb 05/09/2018  . Special screening for malignant neoplasms, colon   . Benign neoplasm of ascending colon   . Benign neoplasm of transverse colon   . Benign neoplasm of descending colon   . Obesity 11/02/2015  . Elevated blood pressure 11/02/2015  . 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 Family history of cardiovascular disease 10/25/2006    Current Outpatient Medications:  .  calcium carbonate (TUMS - DOSED IN MG ELEMENTAL CALCIUM) 500 MG chewable tablet, Chew 1 tablet by mouth as needed for indigestion or heartburn., Disp: , Rfl:   Occasional now not as much since having gallbladder removed October 2019.    Review of Systems  Constitutional: Negative for activity change, appetite change, chills, diaphoresis, fatigue, fever and unexpected weight change.  HENT: Positive for congestion, ear pain (left ), postnasal drip and rhinorrhea. Negative for dental problem, drooling, ear discharge, facial swelling, hearing loss, mouth sores, nosebleeds, sinus pressure, sinus pain, sneezing, sore throat, tinnitus, trouble swallowing and voice change.   Eyes: Negative.   Respiratory: Negative.   Cardiovascular: Negative.   Gastrointestinal: Positive for nausea. Negative for abdominal distention, abdominal pain, anal bleeding, blood in stool, constipation, diarrhea, rectal pain and vomiting.  Endocrine: Negative.   Genitourinary: Negative.   Musculoskeletal: Negative.   Skin: Negative.   Neurological: Positive for dizziness (with head movements only amd  intermittent ). Negative for tremors, seizures, syncope, facial asymmetry, speech difficulty, weakness, light-headedness, numbness and headaches.  Hematological: Negative.   Psychiatric/Behavioral: Negative.        Objective:   Physical Exam Vitals signs reviewed.  Constitutional:      General: He is awake. He is not in acute distress.    Appearance: Normal appearance. He is well-groomed. He is not ill-appearing,  toxic-appearing or diaphoretic.     Comments: Patient is alert and oriented and responsive to questions Engages in eye contact with provider. Speaks in full sentences without any pauses without any shortness of breath or distress.    HENT:     Head: Normocephalic and atraumatic.     Jaw: There is normal jaw occlusion.     Salivary Glands: Right salivary gland is not diffusely enlarged or tender. Left salivary gland is not diffusely enlarged or tender.     Right Ear: Hearing, ear canal and external ear normal. No tenderness. A middle ear effusion is present. Tympanic membrane is not perforated, erythematous or bulging.     Left Ear: Hearing, ear canal and external ear normal. No swelling or tenderness. A middle ear effusion (brown yellow fluid behind intact tympanic membrane) is present. Tympanic membrane is erythematous and bulging. Tympanic membrane is not perforated.     Nose: Mucosal edema present. No congestion or rhinorrhea.     Right Sinus: No maxillary sinus tenderness or frontal sinus tenderness.     Left Sinus: No maxillary sinus tenderness or frontal sinus tenderness.     Mouth/Throat:     Lips: Pink.     Mouth: Mucous membranes are moist.     Pharynx: Oropharynx is clear. Uvula midline. No pharyngeal swelling, oropharyngeal exudate, posterior oropharyngeal erythema or uvula swelling.     Tonsils: No tonsillar exudate or tonsillar abscesses.  Eyes:     General: Lids are normal. Vision grossly intact. Gaze aligned appropriately. Allergic shiner present. No visual field deficit or scleral icterus.       Right eye: No discharge.        Left eye: No discharge.     Extraocular Movements: Extraocular movements intact.     Right eye: Normal extraocular motion and no nystagmus.     Left eye: Normal extraocular motion and no nystagmus.     Conjunctiva/sclera: Conjunctivae normal.     Pupils: Pupils are equal, round, and reactive to light. Pupils are equal.     Funduscopic exam:    Right  eye: No hemorrhage, exudate or AV nicking. Red reflex present.        Left eye: No hemorrhage, exudate or AV nicking. Red reflex present.    Visual Fields: Right eye visual fields normal and left eye visual fields normal.  Neck:     Musculoskeletal: Full passive range of motion without pain, normal range of motion and neck supple. Normal range of motion. No neck rigidity, pain with movement or muscular tenderness.     Thyroid: No thyromegaly.     Vascular: Normal carotid pulses. No carotid bruit, hepatojugular reflux or JVD.     Trachea: Trachea and phonation normal.     Meningeal: Brudzinski's sign absent.  Cardiovascular:     Rate and Rhythm: Normal rate and regular rhythm.     Chest Wall: PMI is not displaced.     Pulses: Normal pulses.          Radial pulses are 2+ on the right side and 2+ on the left side.  Posterior tibial pulses are 2+ on the right side and 2+ on the left side.     Heart sounds: Normal heart sounds. No murmur. No systolic murmur. No diastolic murmur. No friction rub. No gallop.   Pulmonary:     Effort: Pulmonary effort is normal. No accessory muscle usage or respiratory distress.     Breath sounds: Normal breath sounds. No stridor or transmitted upper airway sounds. No decreased breath sounds, wheezing, rhonchi or rales.  Chest:     Chest wall: No tenderness.  Abdominal:     General: Bowel sounds are normal. There is no distension or abdominal bruit. There are no signs of injury.     Palpations: Abdomen is soft. There is no mass.     Tenderness: There is no abdominal tenderness. There is no right CVA tenderness, left CVA tenderness, guarding or rebound. Negative signs include Rovsing's sign, McBurney's sign, psoas sign and obturator sign.     Hernia: No hernia is present.  Musculoskeletal: Normal range of motion.     Right lower leg: No edema.     Left lower leg: No edema.     Comments: Patient moves on and off of exam table and in room without difficulty.  Gait is normal in hall and in room. Patient is oriented to person place time and situation. Patient answers questions appropriately and engages in conversation.   Lymphadenopathy:     Cervical: Cervical adenopathy present.     Right cervical: Superficial cervical adenopathy (mild bilateral soft mobile mildly tender ) present. No deep or posterior cervical adenopathy.    Left cervical: Superficial cervical adenopathy present. No deep or posterior cervical adenopathy.  Skin:    General: Skin is warm and dry.     Capillary Refill: Capillary refill takes less than 2 seconds.     Nails: There is no clubbing.   Neurological:     General: No focal deficit present.     Mental Status: He is alert.     GCS: GCS eye subscore is 4. GCS verbal subscore is 5. GCS motor subscore is 6.     Cranial Nerves: Cranial nerves are intact. No cranial nerve deficit or facial asymmetry.     Sensory: Sensation is intact. No sensory deficit.     Motor: Motor function is intact. No weakness or seizure activity.     Coordination: Coordination is intact. Romberg sign negative. Coordination normal. Finger-Nose-Finger Test and Heel to [facility] Test normal.     Gait: Gait is intact. Gait and tandem walk normal.     Deep Tendon Reflexes: Reflexes normal.     Reflex Scores:      Tricep reflexes are 2+ on the right side and 2+ on the left side.      Bicep reflexes are 2+ on the right side and 2+ on the left side.      Brachioradialis reflexes are 2+ on the right side and 2+ on the left side.      Patellar reflexes are 2+ on the right side and 2+ on the left side.      Achilles reflexes are 2+ on the right side and 2+ on the left side.    Comments: Patient is alert and oriented and responsive to questions Engages in eye contact with provider. Speaks in full sentences without any pauses without any shortness of breath or distress.  Speech regular.   Psychiatric:        Attention and Perception: Attention and perception normal.  He is attentive.        Mood and Affect: Mood and affect normal.        Speech: Speech normal.        Behavior: Behavior normal. Behavior is cooperative.        Thought Content: Thought content normal.        Cognition and Memory: Cognition and memory normal.        Judgment: Judgment normal.     Comments: Oriented to person, place and time.         Assessment:     Non-recurrent acute serous otitis media of left ear  Benign paroxysmal positional vertigo due to bilateral vestibular disorder - Plan: Visual acuity screening, POCT Urinalysis Dipstick (CPT 81002), Comprehensive metabolic panel, CBC with Diff  Eustachian tube dysfunction, left  Mild orthostatic blood pressure changes.      Results for orders placed or performed in visit on 09/21/18 (from the past 24 hour(s))  POCT Urinalysis Dipstick (CPT 81002)     Status: Abnormal   Collection Time: 09/21/18 12:16 PM  Result Value Ref Range   Color, UA Yellow    Clarity, UA Clear    Glucose, UA Negative Negative   Bilirubin, UA Negative    Ketones, UA Negative    Spec Grav, UA <=1.005 (A) 1.010 - 1.025   Blood, UA Negative    pH, UA 5.0 5.0 - 8.0   Protein, UA Negative Negative   Urobilinogen, UA 0.2 0.2 or 1.0 E.U./dL   Nitrite, UA Negative    Leukocytes, UA Negative Negative   Appearance     Odor     Vision 20/20 bilateral eyes. OU 20/20 OD 20/20 . Wears glasses. Snellen chart used by medical assistant.  Plan:     Discussed with patient that with him having mild orthostatic blood pressure changes provider advises emergency room for evaluation.  Patient declines this and is agreeable to signing [facility] is witness)  form after discussing risks versus benefits with patient.  He understands that risk can be not limited to but including death.  He he verbalizes understanding that higher level of care should be sought but declines treatment at another facility.  He understands he can have  someone take him to the emergency room at any time and that this is recommended by provider to go now.  On exam he does have bulging left tympanic membrane that is erythematous and reports pain in this ear over the last few weeks.  Provider will treat eustachian to tube dysfunction and otitis media of left ear. Meds ordered this encounter  Medications  . amoxicillin-clavulanate (AUGMENTIN) 875-125 MG tablet    Sig: Take 1 tablet by mouth 2 (two) times daily.    Dispense:  20 tablet    Refill:  0  . predniSONE (STERAPRED UNI-PAK 21 TAB) 10 MG (21) TBPK tablet    Sig: PO: Take 6 tablets on day 1:Take 5 tablets day 2:Take 4 tablets day 3: Take 3 tablets day 4:Take 2 tablets day five: 5 Take 1 tablet day 6    Dispense:  21 tablet    Refill:  0  . fluticasone (FLONASE) 50 MCG/ACT nasal spray    Sig: Place 2 sprays into both nostrils daily.    Dispense:  16 g    Refill:  0   Provider recommends that patient change positions slowly, increase hydration as he reported to provider that he has not been drinking very  much in the last few days.  He says he gets busy at work and forgets to drink.  So advised to decrease caffeine consumption and replace with water or other non-caffeinated beverage.  Will order CBC and c-Met, however stat labs cannot be ordered in this clinic and patient is aware these results will not be back immediately and will likely take 72 hours to return, again this is not advised and emergency room was advised to have stat labs to be sure no electrolyte disturbances, anemia or other diagnosis patient verbalized risk versus benefits. Patient understands that this treatment does not replace recommendation for patient to be seen in the emergency room today for further work-up and evaluation.  Gave and reviewed After Visit Summary(AVS) with patient. Patient is advised to read the after visit summary as well and let the provider know if any question, concerns or clarifications are needed.    Patient is advised to see his primary care doctor within 3 days of today's visit for evaluation if he chooses not to go to the emergency room.  Advised patient call the office or your primary care doctor for an appointment if no improvement within 72 hours or if any symptoms change or worsen at any time  Advised ER or urgent Care if after hours or on weekend. Call 911 for emergency symptoms at any time.Patinet verbalized understanding of all instructions given/reviewed and treatment plan and has no further questions or concerns at this time.    Patient verbalized understanding of all instructions given and denies any further questions at this time.     Provider thoroughly discussed in collaboration above plan with supervising physician Dr. Miguel Aschoff who is in agreement with the care plan as above.

## 2018-09-22 LAB — COMPREHENSIVE METABOLIC PANEL
ALK PHOS: 127 IU/L — AB (ref 39–117)
ALT: 26 IU/L (ref 0–44)
AST: 20 IU/L (ref 0–40)
Albumin/Globulin Ratio: 1.5 (ref 1.2–2.2)
Albumin: 4.4 g/dL (ref 3.8–4.8)
BILIRUBIN TOTAL: 0.7 mg/dL (ref 0.0–1.2)
BUN/Creatinine Ratio: 11 (ref 10–24)
BUN: 8 mg/dL (ref 8–27)
CHLORIDE: 101 mmol/L (ref 96–106)
CO2: 25 mmol/L (ref 20–29)
Calcium: 9.7 mg/dL (ref 8.6–10.2)
Creatinine, Ser: 0.76 mg/dL (ref 0.76–1.27)
GFR calc Af Amer: 109 mL/min/{1.73_m2} (ref >59)
GFR calc non Af Amer: 94 mL/min/{1.73_m2} (ref >59)
GLUCOSE: 109 mg/dL — AB (ref 65–99)
Globulin, Total: 2.9 g/dL (ref 1.5–4.5)
Potassium: 4.4 mmol/L (ref 3.5–5.2)
Sodium: 143 mmol/L (ref 134–144)
Total Protein: 7.3 g/dL (ref 6.0–8.5)

## 2018-09-22 LAB — CBC WITH DIFFERENTIAL/PLATELET
BASOS ABS: 0.1 10*3/uL (ref 0.0–0.2)
Basos: 1 %
EOS (ABSOLUTE): 0.2 10*3/uL (ref 0.0–0.4)
Eos: 3 %
HEMOGLOBIN: 16.1 g/dL (ref 13.0–17.7)
Hematocrit: 45.9 % (ref 37.5–51.0)
IMMATURE GRANS (ABS): 0 10*3/uL (ref 0.0–0.1)
Immature Granulocytes: 0 %
LYMPHS ABS: 1.5 10*3/uL (ref 0.7–3.1)
LYMPHS: 24 %
MCH: 32.2 pg (ref 26.6–33.0)
MCHC: 35.1 g/dL (ref 31.5–35.7)
MCV: 92 fL (ref 79–97)
MONOCYTES: 8 %
Monocytes Absolute: 0.5 10*3/uL (ref 0.1–0.9)
NEUTROS PCT: 64 %
Neutrophils Absolute: 4 10*3/uL (ref 1.4–7.0)
Platelets: 246 10*3/uL (ref 150–450)
RBC: 5 x10E6/uL (ref 4.14–5.80)
RDW: 12.2 % (ref 11.6–15.4)
WBC: 6.3 10*3/uL (ref 3.4–10.8)

## 2018-09-24 NOTE — Progress Notes (Addendum)
Andrew Villanueva,  Please notify patient that no signs of infection or electrolyte imbalance.His alkaline phos is still elevated however lower than previous.  He should follow up with his primary care.   Follow up with primary care as needed for chronic and maintenance health care- can be seen in this employee clinic for acute care.   PLEASE DISREGARD ERRONEOUS MESSAGE BELOW: patient dose not have elevated PSA ==

## 2018-10-03 ENCOUNTER — Telehealth: Payer: Self-pay | Admitting: Adult Health

## 2018-10-03 NOTE — Telephone Encounter (Addendum)
10/04/2018 9:15 a.m. patient return call to office and was given instructions below.  Patient will follow-up with his primary care provider as instructed.  Patient has no questions and reports his symptoms are resolved from last office visit.  Follow-up as needed.  10/03/2018 attempted to call patient - no answer  At 1011am ) - Would like to inform patient electrolytes were normal  Glucose was elevated - he was not fasting.   Please notify patient that no signs of infection or electrolyte imbalance.His alkaline phos is still elevated however lower than previous.  He should follow up with his primary care.   Follow up with primary care as needed for chronic and maintenance health care- can be seen in this employee clinic for acute care.    Recent Results (from the past 2160 hour(s))  POCT Urinalysis Dipstick (CPT 81002)     Status: Abnormal   Collection Time: 09/21/18 12:16 PM  Result Value Ref Range   Color, UA Yellow    Clarity, UA Clear    Glucose, UA Negative Negative   Bilirubin, UA Negative    Ketones, UA Negative    Spec Grav, UA <=1.005 (A) 1.010 - 1.025   Blood, UA Negative    pH, UA 5.0 5.0 - 8.0   Protein, UA Negative Negative   Urobilinogen, UA 0.2 0.2 or 1.0 E.U./dL   Nitrite, UA Negative    Leukocytes, UA Negative Negative   Appearance     Odor    Comprehensive metabolic panel     Status: Abnormal   Collection Time: 09/21/18 12:44 PM  Result Value Ref Range   Glucose 109 (H) 65 - 99 mg/dL   BUN 8 8 - 27 mg/dL   Creatinine, Ser 0.76 0.76 - 1.27 mg/dL   GFR calc non Af Amer 94 >59 mL/min/1.73   GFR calc Af Amer 109 >59 mL/min/1.73   BUN/Creatinine Ratio 11 10 - 24   Sodium 143 134 - 144 mmol/L   Potassium 4.4 3.5 - 5.2 mmol/L   Chloride 101 96 - 106 mmol/L   CO2 25 20 - 29 mmol/L   Calcium 9.7 8.6 - 10.2 mg/dL   Total Protein 7.3 6.0 - 8.5 g/dL   Albumin 4.4 3.8 - 4.8 g/dL    Comment:               **Please note reference interval change**   Globulin, Total  2.9 1.5 - 4.5 g/dL   Albumin/Globulin Ratio 1.5 1.2 - 2.2   Bilirubin Total 0.7 0.0 - 1.2 mg/dL   Alkaline Phosphatase 127 (H) 39 - 117 IU/L   AST 20 0 - 40 IU/L   ALT 26 0 - 44 IU/L  CBC with Diff     Status: None   Collection Time: 09/21/18 12:44 PM  Result Value Ref Range   WBC 6.3 3.4 - 10.8 x10E3/uL   RBC 5.00 4.14 - 5.80 x10E6/uL   Hemoglobin 16.1 13.0 - 17.7 g/dL   Hematocrit 45.9 37.5 - 51.0 %   MCV 92 79 - 97 fL   MCH 32.2 26.6 - 33.0 pg   MCHC 35.1 31.5 - 35.7 g/dL   RDW 12.2 11.6 - 15.4 %   Platelets 246 150 - 450 x10E3/uL   Neutrophils 64 Not Estab. %   Lymphs 24 Not Estab. %   Monocytes 8 Not Estab. %   Eos 3 Not Estab. %   Basos 1 Not Estab. %   Neutrophils Absolute 4.0 1.4 - 7.0  x10E3/uL   Lymphocytes Absolute 1.5 0.7 - 3.1 x10E3/uL   Monocytes Absolute 0.5 0.1 - 0.9 x10E3/uL   EOS (ABSOLUTE) 0.2 0.0 - 0.4 x10E3/uL   Basophils Absolute 0.1 0.0 - 0.2 x10E3/uL   Immature Granulocytes 0 Not Estab. %   Immature Grans (Abs) 0.0 0.0 - 0.1 x10E3/uL

## 2018-10-03 NOTE — Telephone Encounter (Deleted)
-----   Message from [facility], [place] sent at 10/03/2018  9:22 AM EST ----- Regarding: FW: correct message Here's that message back :) ----- Message ----- From: Doreen Beam, FNP Sent: 10/03/2018   8:46 AM EST To: Dirk Dress Ward, CMA Subject: FW: correct message                            This was from 2/17 ? Did you send to me today ?  ----- Message ----- From: Judie Petit, CMA Sent: 10/03/2018   8:37 AM EST To: Doreen Beam, FNP Subject: FW: correct message                            Can you call him to let him know please? ----- Message ----- From: Doreen Beam, FNP Sent: 09/24/2018   9:26 AM EST To: Birdie Sons, MD, Bretlyn Ward, CMA Subject: correct message                                Bretlyn,  Please notify patient that no signs of infection or electrolyte imbalance.His alkaline phos is still elevated however lower than previous.  He should follow up with his primary care.   Follow up with primary care as needed for chronic and maintenance health care- can be seen in this employee clinic for acute care.   DISREGARD THE MESSAGE BELOW WAS SENT IN ERROR  ----- Message ----- From: Doreen Beam, FNP Sent: 09/24/2018   8:23 AM EST To: Birdie Sons, MD, Bretlyn Ward, CMA  Bretlyn,  Please let patient know that his kidney function was normal, I was checking this as I gave him a refill on his blood pressure medication.  He is instructed to follow-up with primary care physician for any further refills.  He also has a history of elevated PSA and was instructed to follow-up with his primary care as soon as possible to discuss further work-up.  keep plan discussed at last office visit. ( He was not fasting and his glucose is 109, alkaline phosphatase is 127 was higher 4 months ago.)  Follow up with primary care as needed for chronic and maintenance health care- can be seen in this employee clinic for acute care.

## 2018-10-04 NOTE — Progress Notes (Signed)
Spoke with patient 10/04/2018 at 9:01am see telephone encounter.

## 2018-12-06 ENCOUNTER — Encounter: Payer: Managed Care, Other (non HMO) | Admitting: Family Medicine

## 2018-12-07 DIAGNOSIS — C8330 Diffuse large B-cell lymphoma, unspecified site: Secondary | ICD-10-CM

## 2018-12-07 HISTORY — DX: Diffuse large B-cell lymphoma, unspecified site: C83.30

## 2018-12-17 LAB — HM COLONOSCOPY

## 2018-12-20 DIAGNOSIS — C8330 Diffuse large B-cell lymphoma, unspecified site: Secondary | ICD-10-CM | POA: Insufficient documentation

## 2018-12-20 DIAGNOSIS — Z9049 Acquired absence of other specified parts of digestive tract: Secondary | ICD-10-CM | POA: Insufficient documentation

## 2018-12-20 HISTORY — PX: LAPAROSCOPIC PARTIAL COLECTOMY: SHX5907

## 2018-12-21 ENCOUNTER — Encounter: Payer: Self-pay | Admitting: Family Medicine

## 2018-12-23 MED ORDER — HEPARIN SODIUM (PORCINE) 5000 UNIT/ML IJ SOLN
5000.00 | INTRAMUSCULAR | Status: DC
Start: 2018-12-23 — End: 2018-12-23

## 2018-12-23 MED ORDER — GENERIC EXTERNAL MEDICATION
1.00 | Status: DC
Start: ? — End: 2018-12-23

## 2018-12-23 MED ORDER — GENERIC EXTERNAL MEDICATION
Status: DC
Start: ? — End: 2018-12-23

## 2018-12-23 MED ORDER — CELECOXIB 200 MG PO CAPS
200.00 | ORAL_CAPSULE | ORAL | Status: DC
Start: 2018-12-23 — End: 2018-12-23

## 2018-12-23 MED ORDER — NALOXONE HCL 0.4 MG/ML IJ SOLN
.40 | INTRAMUSCULAR | Status: DC
Start: ? — End: 2018-12-23

## 2018-12-23 MED ORDER — MELATONIN 3 MG PO TABS
3.00 | ORAL_TABLET | ORAL | Status: DC
Start: ? — End: 2018-12-23

## 2018-12-23 MED ORDER — ACETAMINOPHEN 325 MG PO TABS
650.00 | ORAL_TABLET | ORAL | Status: DC
Start: 2018-12-23 — End: 2018-12-23

## 2018-12-24 ENCOUNTER — Encounter: Payer: Managed Care, Other (non HMO) | Admitting: Family Medicine

## 2019-01-01 ENCOUNTER — Other Ambulatory Visit: Payer: Self-pay

## 2019-01-01 ENCOUNTER — Ambulatory Visit: Payer: Managed Care, Other (non HMO) | Admitting: Family Medicine

## 2019-01-01 ENCOUNTER — Encounter: Payer: Self-pay | Admitting: Family Medicine

## 2019-01-01 DIAGNOSIS — C8333 Diffuse large B-cell lymphoma, intra-abdominal lymph nodes: Secondary | ICD-10-CM

## 2019-01-01 NOTE — Progress Notes (Signed)
Patient: Andrew Villanueva Male    DOB: 1951-04-23   68 y.o.   MRN: [id number] Visit Date: 01/01/2019  Today's Provider: Lelon Huh, MD   Chief Complaint  Patient presents with  . Hospitalization Follow-up   Subjective:     HPI  Follow up Hospitalization  Patient was admitted to [facility] on 12/13/2018 and discharged on 12/23/2018. He was treated for abdominal pain and was found to have  abdominal mass. Treatment for this included undergoing ileocolic resection. Patient was to follow up with GI surgical in 3 weeks as outpatient. 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 He reports this condition is Improved. Patient has an appointment to follow up with [facility] GI and hematology on 01/02/2019.  He is scheduled for PET scan at [facility] on 01-03-2019.  He has had minimal pain. Has had intermittent serous discharge from wound. No pus. No redness around incision. His appetite is normal. BM have been a little loose, but getting back to normal. He had a little bright red blood in stool yesterday which he things was from a hemorrhoid, otherwise stool has been normal in color since surgery. He was slightly anemic upon discharge from [facility] ------------------------------------------------------------------------------------   Immunization History  Administered Date(s) Administered  . Influenza,inj,Quad PF,6+ Mos 07/09/2015, 06/15/2018  . Pneumococcal Conjugate-13 11/21/2016  . Pneumococcal Polysaccharide-23 12/04/2017  . Tdap 10/25/2006, 04/01/2015  . Zoster 11/07/2015     No Known Allergies   Current Outpatient Medications:  .  acetaminophen (TYLENOL) 500 MG tablet, Take 500 mg by mouth every 6 (six) hours as needed., Disp: , Rfl:  .  calcium carbonate (TUMS - DOSED IN MG ELEMENTAL CALCIUM) 500 MG chewable tablet, Chew 1 tablet by mouth as needed for indigestion or heartburn., Disp: , Rfl:   Review of Systems  Constitutional:  Negative for appetite change, chills and fever.  Respiratory: Negative for chest tightness, shortness of breath and wheezing.   Cardiovascular: Negative for chest pain and palpitations.  Gastrointestinal: Positive for abdominal pain. Negative for nausea and vomiting.    Social History   Tobacco Use  . Smoking status: Never Smoker  . Smokeless tobacco: Never Used  Substance Use Topics  . Alcohol use: Yes    Alcohol/week: 2.0 standard drinks    Types: 2 Cans of beer per week    Comment: minimal alcohol consumption      Objective:   BP 125/78 (BP Location: Left Arm, Patient Position: Sitting, Cuff Size: Large)   Pulse 65   Temp 98.5 F (36.9 C) (Oral)   Resp 16   Wt 208 lb (94.3 kg)   SpO2 98% Comment: room air  BMI 27.44 kg/m  Vitals:   01/01/19 0815  BP: 125/78  Pulse: 65  Resp: 16  Temp: 98.5 F (36.9 C)  TempSrc: Oral  SpO2: 98%  Weight: 208 lb (94.3 kg)     Physical Exam  General Appearance:    Alert, cooperative, no distress  Eyes:    PERRL, conjunctiva/corneas clear, EOM's intact       Lungs:     Clear to auscultation bilaterally, respirations unlabored  Heart:    Regular rate and rhythm  Abdomen:   bowel sounds present and normal in all 4 quadrants, soft, round, nontender or nondistended. 6cm midline vertical surgical incision above umbilicus, clean, well approximated, no surrounding erythema. Scan serous discharge. No unusual tenderness around incision site.  Assessment & Plan    1. 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 No sign of infection. Otherwise asymptomatic. Reinforced importance of social distancing and vigilant precautions to reduce risk of Covid-19 as he is at high risk. Is to follow up at [facility] with Hemeonc and surgery as scheduled tomorrow.      Lelon Huh, MD  [facility]

## 2019-01-01 NOTE — Patient Instructions (Signed)
.   Please review the attached list of medications and notify my office if there are any errors.   . Please bring all of your medications to every appointment so we can make sure that our medication list is the same as yours.   

## 2019-01-04 ENCOUNTER — Inpatient Hospital Stay: Payer: Self-pay | Admitting: Family Medicine

## 2019-01-14 ENCOUNTER — Inpatient Hospital Stay: Payer: Managed Care, Other (non HMO) | Admitting: Family Medicine

## 2019-01-18 ENCOUNTER — Other Ambulatory Visit: Payer: Self-pay

## 2019-01-18 ENCOUNTER — Encounter: Payer: Self-pay | Admitting: Family Medicine

## 2019-01-18 ENCOUNTER — Ambulatory Visit (INDEPENDENT_AMBULATORY_CARE_PROVIDER_SITE_OTHER): Payer: Managed Care, Other (non HMO) | Admitting: Family Medicine

## 2019-01-18 VITALS — BP 126/83 | HR 85 | Temp 98.3°F | Ht 73.0 in | Wt 204.0 lb

## 2019-01-18 DIAGNOSIS — Z0000 Encounter for general adult medical examination without abnormal findings: Secondary | ICD-10-CM

## 2019-01-18 DIAGNOSIS — Z125 Encounter for screening for malignant neoplasm of prostate: Secondary | ICD-10-CM

## 2019-01-18 DIAGNOSIS — C8333 Diffuse large B-cell lymphoma, intra-abdominal lymph nodes: Secondary | ICD-10-CM

## 2019-01-18 NOTE — Patient Instructions (Signed)
.   Please review the attached list of medications and notify my office if there are any errors.   . Please bring all of your medications to every appointment so we can make sure that our medication list is the same as yours.   

## 2019-01-18 NOTE — Progress Notes (Signed)
Patient: Andrew Villanueva, Male    DOB: May 18, 1951, 68 y.o.   MRN: [id number] Visit Date: 01/18/2019  Today's Provider: Lelon Huh, MD   Chief Complaint  Patient presents with  . Annual Exam   Subjective:     Complete Physical Andrew Villanueva is a 68 y.o. male. He feels well. He reports walking for exercise. He reports he is sleeping well.  -----------------------------------------------------------   Review of Systems  Constitutional: Negative.   HENT: Negative.   Eyes: Negative.   Respiratory: Negative.   Cardiovascular: Negative.   Gastrointestinal: Negative.   Endocrine: Negative.   Genitourinary: Negative.   Musculoskeletal: Negative.   Skin: Negative.   Allergic/Immunologic: Negative.   Neurological: Negative.   Hematological: Negative.   Psychiatric/Behavioral: Negative.     Social History   Socioeconomic History  . Marital status: Married    Spouse name: Not on file  . Number of children: 2  . Years of education: Not on file  . Highest education level: Not on file  Occupational History  . Occupation: Associate Professor  Social Needs  . Financial resource strain: Not on file  . Food insecurity    Worry: Not on file    Inability: Not on file  . Transportation needs    Medical: Not on file    Non-medical: Not on file  Tobacco Use  . Smoking status: Never Smoker  . Smokeless tobacco: Never Used  Substance and Sexual Activity  . Alcohol use: Yes    Alcohol/week: 2.0 standard drinks    Types: 2 Cans of beer per week    Comment: minimal alcohol consumption  . Drug use: No  . Sexual activity: Not on file  Lifestyle  . Physical activity    Days per week: Not on file    Minutes per session: Not on file  . Stress: Not on file  Relationships  . Social Herbalist on phone: Not on file    Gets together: Not on file    Attends religious service: Not on file    Active member of club or organization: Not on file    Attends  meetings of clubs or organizations: Not on file    Relationship status: Not on file  . Intimate partner violence    Fear of current or ex partner: Not on file    Emotionally abused: Not on file    Physically abused: Not on file    Forced sexual activity: Not on file  Other Topics Concern  . Not on file  Social History Narrative  . Not on file    Past Medical History:  Diagnosis Date  . Arthritis   . Calculus of bile duct without cholangitis or biliary obstruction   . Calculus of gallbladder without cholecystitis without obstruction 05/09/2018  . Complication of anesthesia    1ST COLONOSCOPY PT HAD TROUBLE WAKING UP   . GERD (gastroesophageal reflux disease)    OCC-TUMS PRN  . History of measles   . Wears dentures    upper partial     Patient Active Problem List   Diagnosis Date Noted  . 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 Benign paroxysmal positional vertigo due to bilateral vestibular disorder 09/21/2018  . Eustachian tube dysfunction, left 09/21/2018  . Hepatic steatosis 05/10/2018  . Left inguinal hernia 05/09/2018  . Trigger thumb 05/09/2018  . Obesity 11/02/2015  . Elevated blood pressure 11/02/2015  . 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  Family history of cardiovascular disease 10/25/2006    Past Surgical History:  Procedure Laterality Date  . CHOLECYSTECTOMY N/A 05/30/2018   Procedure: LAPAROSCOPIC CHOLECYSTECTOMY;  Surgeon: Vickie Epley, MD;  Location: [facility];  Service: General;  Laterality: N/A;  . COLONOSCOPY    . COLONOSCOPY WITH PROPOFOL N/A 01/23/2017   Procedure: COLONOSCOPY WITH PROPOFOL;  Surgeon: Lucilla Lame, MD;  Location: [place];  Service: Endoscopy;  Laterality: N/A;  . LAPAROSCOPIC PARTIAL COLECTOMY  12/20/2018   laparascopy, surgical, colectomy, partial with removal of terminal ileum with ileocolostomy. [facility] Dr. Victorio Palm  . POLYPECTOMY  01/23/2017   Procedure: POLYPECTOMY;  Surgeon: Lucilla Lame, MD;  Location: [place];  Service: Endoscopy;;    His family history includes CAD in his father; Diabetes in his father and mother; Heart attack in his mother; Lung cancer in his brother. There is no history of Colon cancer or Prostate cancer.   Current Outpatient Medications:  .  acetaminophen (TYLENOL) 500 MG tablet, Take 500 mg by mouth every 6 (six) hours as needed., Disp: , Rfl:  .  calcium carbonate (TUMS - DOSED IN MG ELEMENTAL CALCIUM) 500 MG chewable tablet, Chew 1 tablet by mouth as needed for indigestion or heartburn., Disp: , Rfl:   Patient Care Team: Birdie Sons, MD as PCP - General (Family Medicine)     Objective:    Vitals: BP 126/83 (BP Location: Right Arm, Patient Position: Sitting, Cuff Size: Normal)   Pulse 85   Temp 98.3 F (36.8 C) (Oral)   Ht 6\' 1"  (1.854 m)   Wt 204 lb (92.5 kg)   BMI 26.91 kg/m   Physical Exam  General Appearance:    Alert, cooperative, no distress, appears stated age  Head:    Normocephalic, without obvious abnormality, atraumatic  Eyes:    PERRL, conjunctiva/corneas clear, EOM's intact, fundi    benign, both eyes       Ears:    Normal TM's and external ear canals, both ears  Nose:   Nares normal, septum midline, mucosa normal, no drainage   or sinus tenderness  Throat:   Lips, mucosa, and tongue normal; teeth and gums normal  Neck:   Supple, symmetrical, trachea midline, no adenopathy;       thyroid:  No enlargement/tenderness/nodules; no carotid   bruit or JVD  Back:     Symmetric, no curvature, ROM normal, no CVA tenderness  Lungs:     Clear to auscultation bilaterally, respirations unlabored  Chest wall:    No tenderness or deformity  Heart:    Regular rate and rhythm, S1 and S2 normal, no murmur, rub   or gallop  Abdomen:     Soft, non-tender, bowel sounds active all four quadrants,    no masses, no organomegaly  Genitalia:    deferred  Rectal:    deferred  Extremities:   Extremities normal, atraumatic, no cyanosis or edema  Pulses:    2+ and symmetric all extremities  Skin:   Skin color, texture, turgor normal, no rashes or lesions  Lymph nodes:   Cervical, supraclavicular, and axillary nodes normal  Neurologic:   CNII-XII intact. Normal strength, sensation and reflexes      throughout    Activities of Daily Living In your present state of health, do you have any difficulty performing the following activities: 05/23/2018  Hearing? N  Vision? N  Difficulty concentrating or making decisions? N  Walking or climbing stairs? N  Dressing or bathing? N  Doing errands, shopping? N  Some recent data might be hidden    Fall Risk Assessment Fall Risk  06/14/2018 12/04/2017 11/21/2016  Falls in the past year? 0 No No  Follow up Falls evaluation completed - -     Depression Screen PHQ 2/9 Scores 01/01/2019 12/04/2017 11/21/2016 11/21/2016  PHQ - 2 Score 0 0 0 0  PHQ- 9 Score 2 0 1 1    No flowsheet data found.     Assessment & Plan:    Annual Physical Reviewed patient's Family Medical History Reviewed and updated list of patient's medical providers Assessment of cognitive impairment was done Assessed patient's functional ability Established a written schedule for health screening [place] Completed and Reviewed  Exercise Activities and Dietary recommendations Goals   None     Immunization History  Administered Date(s) Administered  . Influenza,inj,Quad PF,6+ Mos 07/09/2015, 06/15/2018  . Pneumococcal Conjugate-13 11/21/2016  . Pneumococcal Polysaccharide-23 12/04/2017  . Tdap 10/25/2006, 04/01/2015  . Zoster 11/07/2015    Health Maintenance  Topic Date Due  . INFLUENZA VACCINE  03/09/2019  . COLONOSCOPY  01/23/2022  . TETANUS/TDAP  03/31/2025  . Hepatitis C Screening  Completed  . PNA vac Low Risk Adult  Completed     Discussed health benefits of physical activity, and encouraged him to engage in regular exercise appropriate for his age and condition.     ------------------------------------------------------------------------------------------------------------  1. Annual physical exam Normal exam. Up to date on vaccines. Consider shigrix after chemotherapy.   2. Prostate cancer screening  - PSA  3. Diffuse large B-cell lymphoma of intra-abdominal lymph nodes (HCC) Start chemotherapy in [place] next week.    Lelon Huh, MD  [facility]

## 2019-01-19 LAB — PSA: Prostate Specific Ag, Serum: 1 ng/mL (ref 0.0–4.0)

## 2019-01-21 ENCOUNTER — Telehealth: Payer: Self-pay

## 2019-01-21 NOTE — Telephone Encounter (Signed)
Pt advised.   Thanks,   -Filiberto Wamble  

## 2019-01-21 NOTE — Telephone Encounter (Signed)
-----   Message from Birdie Sons, MD sent at 01/19/2019 10:20 PM EDT ----- PSA is normal. Check yearly.

## 2019-05-31 ENCOUNTER — Ambulatory Visit: Payer: Managed Care, Other (non HMO)

## 2019-05-31 ENCOUNTER — Other Ambulatory Visit: Payer: Self-pay

## 2019-05-31 DIAGNOSIS — Z13220 Encounter for screening for lipoid disorders: Secondary | ICD-10-CM

## 2019-05-31 DIAGNOSIS — Z23 Encounter for immunization: Secondary | ICD-10-CM | POA: Diagnosis not present

## 2019-05-31 DIAGNOSIS — Z136 Encounter for screening for cardiovascular disorders: Secondary | ICD-10-CM

## 2019-05-31 NOTE — Addendum Note (Signed)
Addended by: Doreen Beam on: 05/31/2019 10:21 AM   Modules accepted: Orders

## 2019-05-31 NOTE — Addendum Note (Signed)
Addended by: Jay Schlichter D on: 05/31/2019 11:31 AM   Modules accepted: Orders

## 2019-05-31 NOTE — Addendum Note (Signed)
Addended by: Jay Schlichter D on: 05/31/2019 10:38 AM   Modules accepted: Orders

## 2019-06-03 ENCOUNTER — Ambulatory Visit: Payer: Self-pay | Admitting: Family Medicine

## 2019-06-04 LAB — LIPID PANEL
Chol/HDL Ratio: 2.3 ratio (ref 0.0–5.0)
Cholesterol, Total: 154 mg/dL (ref 100–199)
HDL: 66 mg/dL (ref >39)
LDL Chol Calc (NIH): 73 mg/dL (ref 0–99)
Triglycerides: 81 mg/dL (ref 0–149)
VLDL Cholesterol Cal: 15 mg/dL (ref 5–40)

## 2019-06-05 ENCOUNTER — Telehealth: Payer: Self-pay | Admitting: Family Medicine

## 2019-06-05 NOTE — Telephone Encounter (Signed)
Dr. Caryn Section, do you have any recommendations, or should patient schedule OV to discuss insomnia symptoms.

## 2019-06-05 NOTE — Telephone Encounter (Signed)
Pt's wife called saying her husband is not sleeping at night.  She says he is not sleepy during the day but just cant sleep at night.  CB#  [phone]  teri

## 2019-06-07 MED ORDER — TRAZODONE HCL 100 MG PO TABS: 100.0000 mg | ORAL_TABLET | Freq: Every day | ORAL | 1 refills | Status: DC

## 2019-06-07 NOTE — Telephone Encounter (Signed)
Patient and patient's wife advised 

## 2019-06-07 NOTE — Telephone Encounter (Signed)
LMTCB

## 2019-06-07 NOTE — Telephone Encounter (Signed)
Pt returned missed call.  Please call pt back at [phone].  Thanks, [employer]

## 2019-06-07 NOTE — Telephone Encounter (Signed)
Pt's wife called back this morning regarding her husband not being able to sleep.  She would like to get advise from Dr. Caryn Section as what to do.  Con [place]

## 2019-06-07 NOTE — Telephone Encounter (Signed)
Can try prescription trazodone 100mg  hs, #30 rf x 1. If not helping within a week then schedule office visit for evaluation

## 2019-06-29 ENCOUNTER — Other Ambulatory Visit: Payer: Self-pay | Admitting: Family Medicine

## 2019-08-05 IMAGING — CT CT ABD-PELV W/ CM
2 of 5 series · 16 of 46 positions shown, 18 images · IV contrast (APPLIED)
Comparison: Limited right upper quadrant abdomen ultrasound
obtained earlier today.

CLINICAL DATA: Epigastric abdominal pain since 2 p.m. today. Some
nausea with abdominal pain this morning.

EXAM:
CT ABDOMEN AND PELVIS WITH CONTRAST
TECHNIQUE: Multidetector CT imaging of the abdomen and pelvis was performed
using the standard protocol following bolus administration of
intravenous contrast.
CONTRAST:  100mL [id number] IOPAMIDOL ([id number]) INJECTION 61%

[Series 2: axial st · axial · 0.83mm/px · z∈[-493,-63]mm · 13 of 98 slices shown, 15 images]
[im 6/98  soft-tissue]
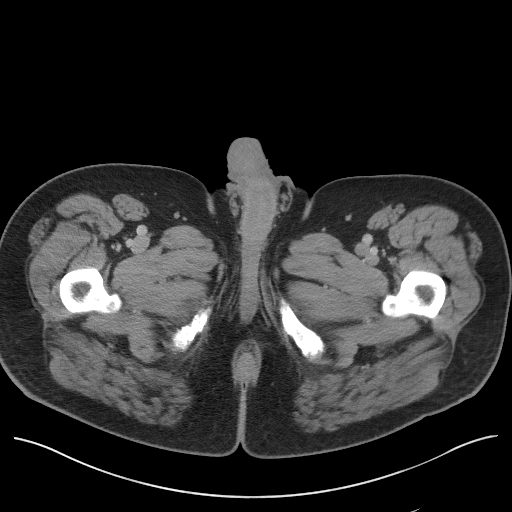
[im 6/98  bone]
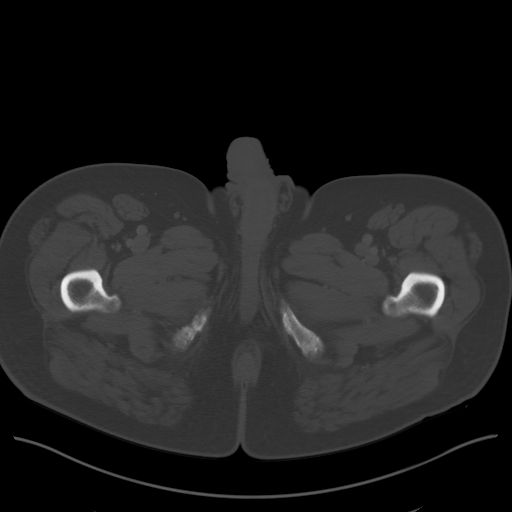
[im 11/98  soft-tissue]
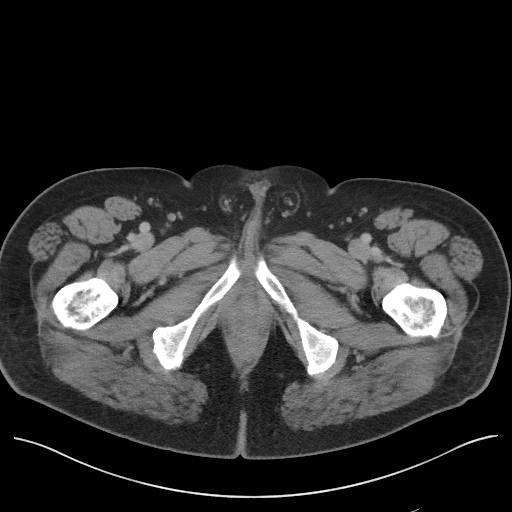
[im 22/98  soft-tissue]
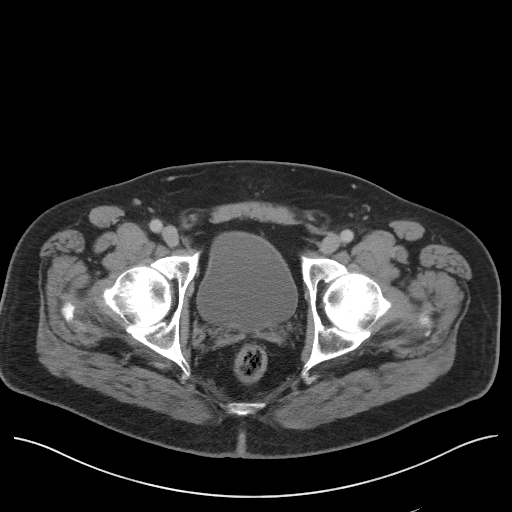
[im 27/98  soft-tissue]
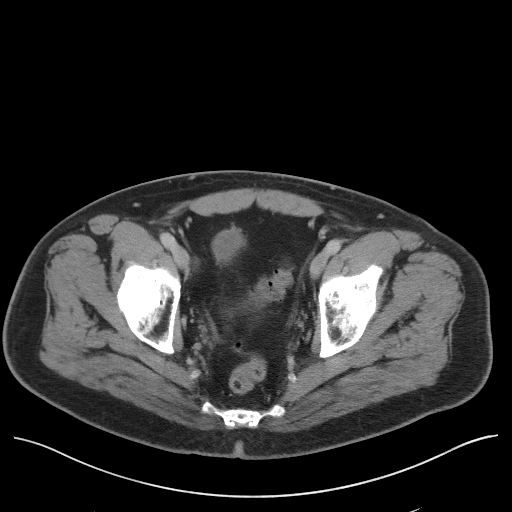
[im 33/98  soft-tissue]
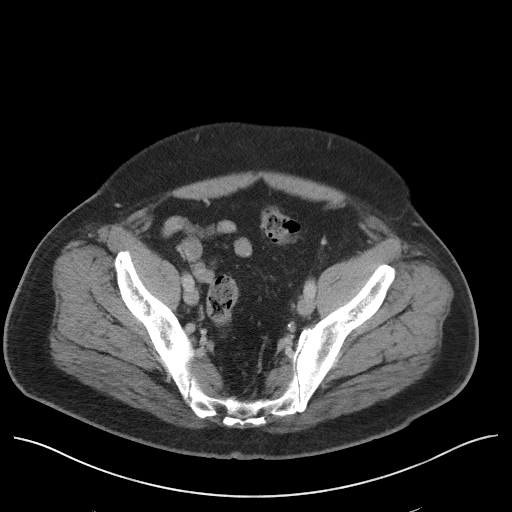
[im 44/98  soft-tissue]
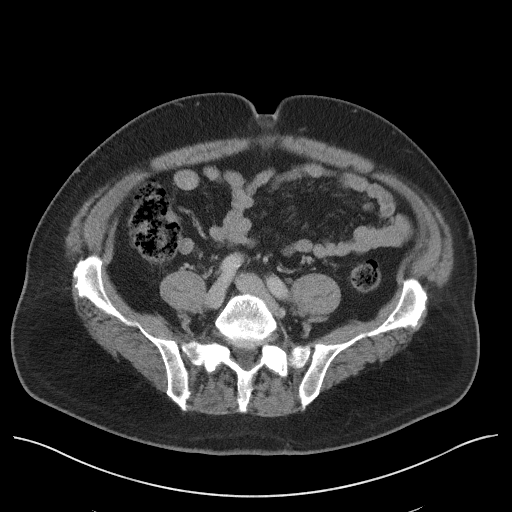
[im 49/98  soft-tissue]
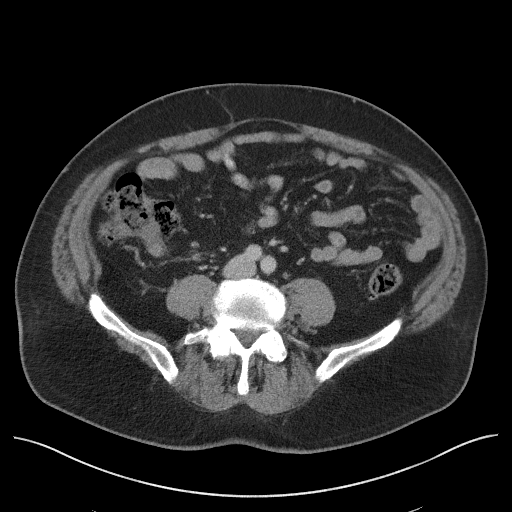
[im 54/98  soft-tissue]
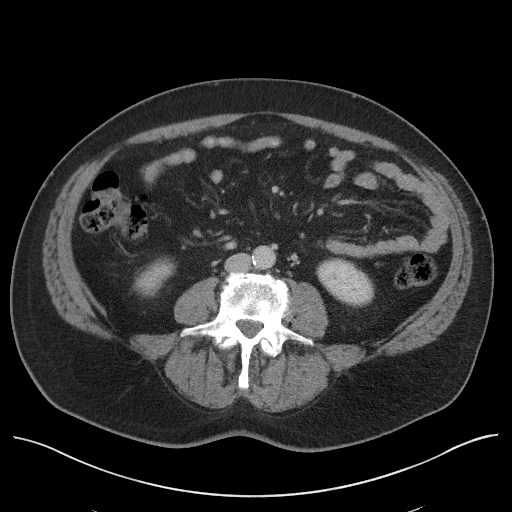
[im 65/98  soft-tissue]
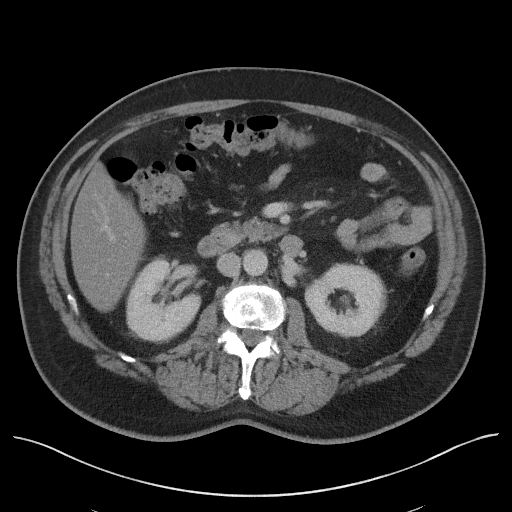
[im 65/98  bone]
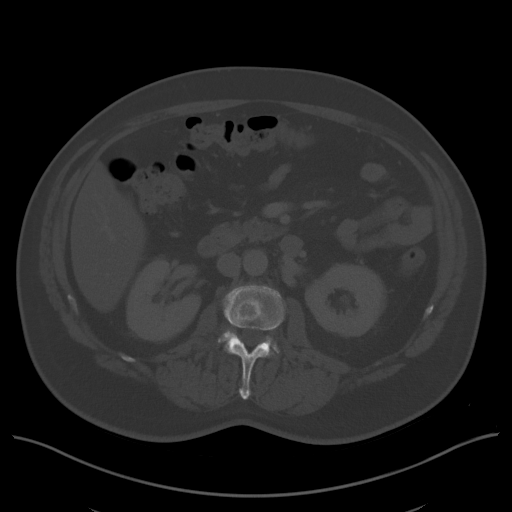
[im 71/98  soft-tissue]
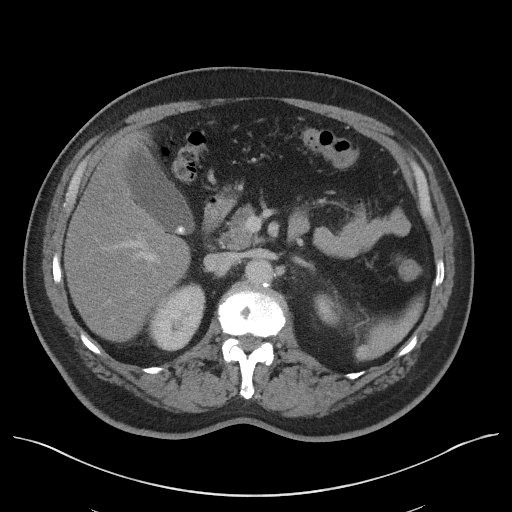
[im 76/98  soft-tissue]
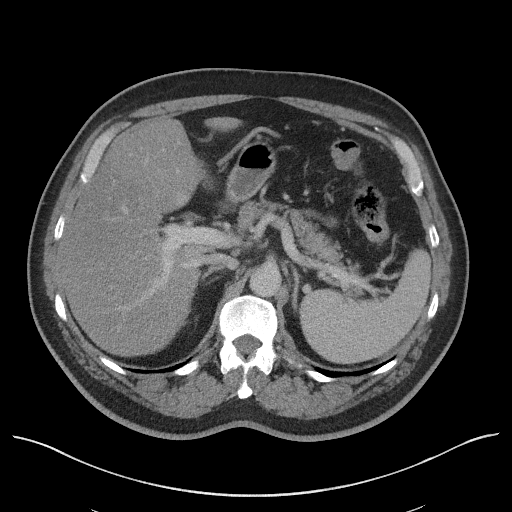
[im 87/98  soft-tissue]
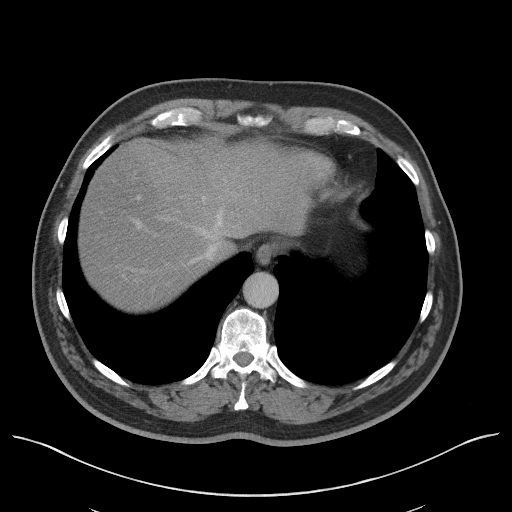
[im 92/98  soft-tissue]
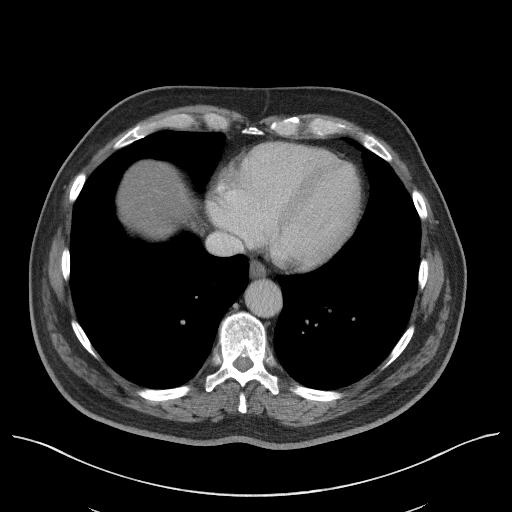

[Series 5: coronal st · coronal · 0.85mm/px · 3 of 103 slices shown]
[im 35/103  soft-tissue]
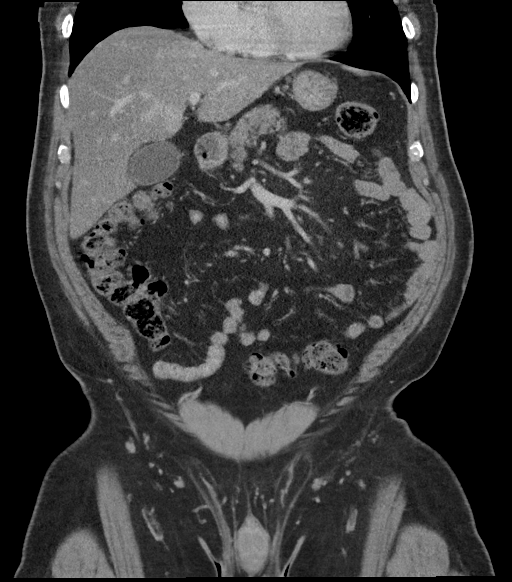
[im 46/103  soft-tissue]
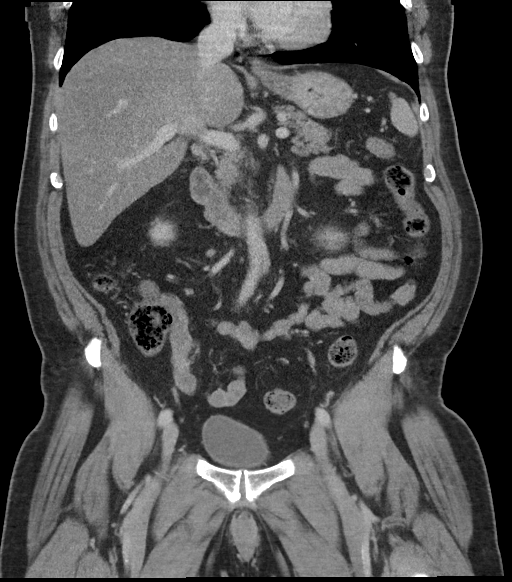
[im 57/103  soft-tissue]
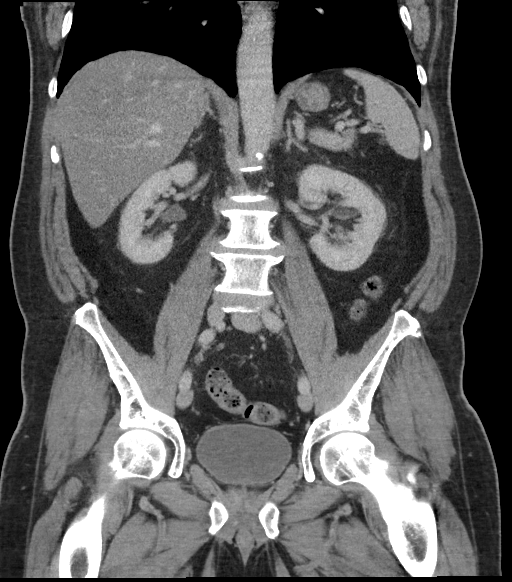

[16 of 46 positions shown; findings below may reference images not displayed]

FINDINGS: Lower chest: Clear lung bases. Atheromatous coronary artery
calcifications.

Hepatobiliary: Diffuse low density of the liver relative to the
spleen. Previously demonstrated gallstone in the gallbladder,
measure 7 mm. No gallbladder wall thickening or pericholecystic
fluid.

Pancreas: Unremarkable. No pancreatic ductal dilatation or
surrounding inflammatory changes.

Spleen: Normal in size without focal abnormality.

1

Adrenals/Urinary Tract: Adrenal glands are unremarkable. Kidneys are
normal, without renal calculi, focal lesion, or hydronephrosis.
Bladder is unremarkable.

Stomach/Bowel: Stomach is within normal limits. Appendix appears
normal. No evidence of bowel wall thickening, distention, or
inflammatory changes.

Vascular/Lymphatic: Mild atheromatous arterial calcifications
without aneurysm. No enlarged lymph nodes.

Reproductive: Prostate is unremarkable.

Other: Small left inguinal hernia containing fat. Small umbilical
hernia containing fat

Musculoskeletal: Lumbar and lower thoracic spine degenerative
changes.
IMPRESSION: 1. No acute abnormality.
2. Diffuse hepatic steatosis.
3. Cholelithiasis.
4. Coronary artery atherosclerosis.

## 2019-09-09 HISTORY — PX: HERNIA REPAIR: SHX51

## 2019-10-18 ENCOUNTER — Telehealth: Payer: Self-pay

## 2019-10-18 NOTE — Telephone Encounter (Signed)
Patient advised.

## 2019-10-18 NOTE — Telephone Encounter (Signed)
If he has any trouble breathing or chest pains then go to ER.  Otherwise keep feet elevated when not walking. Avoid salty foods. Might want to get some compression stocking from pharmacy.

## 2019-10-18 NOTE — Telephone Encounter (Signed)
Copied from [place] [id number] [id number]. Topic: General - Other >> Oct 18, 2019  8:38 AM Leward Quan A wrote: Reason for CRM: Patient wife called to get an appointment for patient to see Dr Caryn Section but nothing available until 10/22/19. But she wanted to inform Dr Caryn Section that he is having swelling in his legs was informed by the surgeon to elevate the legs. Asking if there is any suggestions from Dr Caryn Section please call Ph# [phone]

## 2020-01-20 ENCOUNTER — Ambulatory Visit (INDEPENDENT_AMBULATORY_CARE_PROVIDER_SITE_OTHER): Payer: Managed Care, Other (non HMO) | Admitting: Family Medicine

## 2020-01-20 ENCOUNTER — Encounter: Payer: Self-pay | Admitting: Family Medicine

## 2020-01-20 ENCOUNTER — Other Ambulatory Visit: Payer: Self-pay

## 2020-01-20 VITALS — BP 126/80 | HR 73 | Temp 97.1°F | Ht 73.0 in | Wt 219.0 lb

## 2020-01-20 DIAGNOSIS — C8333 Diffuse large B-cell lymphoma, intra-abdominal lymph nodes: Secondary | ICD-10-CM

## 2020-01-20 DIAGNOSIS — Z125 Encounter for screening for malignant neoplasm of prostate: Secondary | ICD-10-CM

## 2020-01-20 DIAGNOSIS — N63 Unspecified lump in unspecified breast: Secondary | ICD-10-CM | POA: Diagnosis not present

## 2020-01-20 DIAGNOSIS — Z0000 Encounter for general adult medical examination without abnormal findings: Secondary | ICD-10-CM | POA: Diagnosis not present

## 2020-01-20 NOTE — Progress Notes (Signed)
I,Laura E Walsh,acting as a scribe for Lelon Huh, MD.,have documented all relevant documentation on the behalf of Lelon Huh, MD,as directed by  Lelon Huh, MD while in the presence of Lelon Huh, MD.    Annual Wellness Visit     Patient: Andrew Villanueva, Male    DOB: March 14, 1951, 69 y.o.   MRN: [id number] Visit Date: 01/20/2020  Today's Provider: Lelon Huh, MD   Chief Complaint  Patient presents with  . Annual Exam   Subjective    Andrew Villanueva is a 69 y.o. male who presents today for his Annual Wellness Visit. He reports consuming a general diet. The patient does not participate in regular exercise at present. He generally feels fairly well. He reports sleeping well. He does have additional problems to discuss today.  He is going to [facility] for treatment of b-cell lymphoma. He has eye doctor in [place] that he sees annually.   HPI  Pt reports having left breat swelling for four months, a little sore and tender.   Had labs at [facility]      Medications: Outpatient Medications Prior to Visit  Medication Sig  . traZODone (DESYREL) 100 MG tablet TAKE 1 TABLET BY MOUTH EVERYDAY AT BEDTIME (Patient not taking: Reported on 01/20/2020)  . [DISCONTINUED] acetaminophen (TYLENOL) 500 MG tablet Take 500 mg by mouth every 6 (six) hours as needed. (Patient not taking: Reported on 01/20/2020)  . [DISCONTINUED] calcium carbonate (TUMS - DOSED IN MG ELEMENTAL CALCIUM) 500 MG chewable tablet Chew 1 tablet by mouth as needed for indigestion or heartburn. (Patient not taking: Reported on 01/20/2020)   No facility-administered medications prior to visit.    No Known Allergies  Patient Care Team: Birdie Sons, MD as PCP - General (Family Medicine) Bridget Hartshorn, Noel Journey, MD as Referring Physician (Dermatology) Larina Earthly, MD as Referring Physician (Hematology and Oncology)  Review of Systems  Constitutional: Negative.   HENT: Negative.   Eyes: Negative.     Respiratory: Negative.   Cardiovascular: Negative.   Gastrointestinal: Negative.   Endocrine: Negative.   Genitourinary: Negative.   Musculoskeletal: Negative.   Skin: Negative.   Allergic/Immunologic: Negative.   Neurological: Positive for light-headedness and numbness. Negative for dizziness, tremors, seizures, syncope, facial asymmetry, speech difficulty, weakness and headaches.  Hematological: Negative.   Psychiatric/Behavioral: Negative.       Objective    Vitals: BP 126/80 (BP Location: Left Arm, Patient Position: Sitting, Cuff Size: Large)   Pulse 73   Temp (!) 97.1 F (36.2 C) (Temporal)   Ht 6' 1"  (1.854 m)   Wt 219 lb (99.3 kg)   SpO2 97%   BMI 28.89 kg/m    Physical Exam  General Appearance:     Overweight male. Alert, cooperative, in no acute distress, appears stated age  Head:    Normocephalic, without obvious abnormality, atraumatic  Eyes:    PERRL, conjunctiva/corneas clear, EOM's intact, fundi    benign, both eyes       Ears:    Normal TM's and external ear canals, both ears  Nose:   Nares normal, septum midline, mucosa normal, no drainage   or sinus tenderness  Throat:   Lips, mucosa, and tongue normal; teeth and gums normal  Neck:   Supple, symmetrical, trachea midline, no adenopathy;       thyroid:  No enlargement/tenderness/nodules; no carotid   bruit or JVD  Back:     Symmetric, no curvature, ROM normal, no CVA tenderness  Lungs:  Clear to auscultation bilaterally, respirations unlabored  Chest wall:    Pea size left retroareolar slightly tender mass with generalized enlargement of left breast compared to right. No discharge, no erythema, no dimpling.   Heart:    Normal heart rate. Normal rhythm. No murmurs, rubs, or gallops.  S1 and S2 normal  Abdomen:     Soft, non-tender, bowel sounds active all four quadrants,    no masses, no organomegaly  Genitalia:    deferred  Rectal:    deferred  Extremities:   All extremities are intact. No cyanosis  or edema  Pulses:   2+ and symmetric all extremities  Skin:   Skin color, texture, turgor normal, no rashes or lesions  Lymph nodes:   Cervical, supraclavicular, and axillary nodes normal  Neurologic:   CNII-XII intact. Normal strength, sensation and reflexes      throughout     Most recent functional status assessment: In your present state of health, do you have any difficulty performing the following activities: 01/20/2020  Hearing? N  Vision? N  Difficulty concentrating or making decisions? N  Walking or climbing stairs? N  Dressing or bathing? N  Doing errands, shopping? N  Some recent data might be hidden    Most recent fall risk assessment: Fall Risk  01/20/2020  Falls in the past year? 0  Number falls in past yr: 0  Injury with Fall? 0  Follow up Falls evaluation completed     Most recent depression screenings: PHQ 2/9 Scores 01/20/2020 01/18/2019  PHQ - 2 Score 0 1  PHQ- 9 Score 1 3    Most recent cognitive screening: 6CIT Screen 01/20/2020  What Year? 0 points  What month? 0 points  What time? 0 points  Count back from 20 0 points  Months in reverse 0 points  Repeat phrase 2 points  Total Score 2    No results found for any visits on 01/20/20.  Assessment & Plan     Annual wellness visit done today including the all of the following: Reviewed patient's Family Medical History Reviewed and updated list of patient's medical providers Assessment of cognitive impairment was done Assessed patient's functional ability Established a written schedule for health screening [place] Completed and Reviewed  Exercise Activities and Dietary recommendations Goals   None     Immunization History  Administered Date(s) Administered  . Fluad Quad(high Dose 65+) 05/31/2019  . Influenza,inj,Quad PF,6+ Mos 07/09/2015, 06/15/2018  . Moderna SARS-COVID-2 Vaccination 09/03/2019, 10/01/2019  . Pneumococcal Conjugate-13 11/21/2016  . Pneumococcal  Polysaccharide-23 12/04/2017  . Tdap 10/25/2006, 04/01/2015  . Zoster 11/07/2015    Health Maintenance  Topic Date Due  . INFLUENZA VACCINE  03/08/2020  . COLONOSCOPY  01/23/2022  . TETANUS/TDAP  03/31/2025  . COVID-19 Vaccine  Completed  . Hepatitis C Screening  Completed  . PNA vac Low Risk Adult  Completed     Discussed health benefits of physical activity, and encouraged him to engage in regular exercise appropriate for his age and condition.    Reviewed recent unremarkable labs from [facility] including met c and CBC. Lipids were done in October 2020.   2. Prostate cancer screening  - PSA Total (Reflex To Free) ([facility] only)  3. 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   4. Breast mass in male Had normal testosterone and estradiole levels done recently at [facility] oncology.  - US BREAST LTD UNI LEFT  INC AXILLA; Future   No follow-ups on file.     The entirety of the information documented in the History of Present Illness, Review of Systems and Physical Exam were personally obtained by me. Portions of this information were initially documented by the CMA and reviewed by me for thoroughness and accuracy.      Lelon Huh, MD  [facility] [phone] (phone) [phone] (fax)  [place]

## 2020-01-21 LAB — PSA TOTAL (REFLEX TO FREE): Prostate Specific Ag, Serum: 1 ng/mL (ref 0.0–4.0)

## 2020-01-27 ENCOUNTER — Telehealth: Payer: Self-pay

## 2020-01-27 ENCOUNTER — Telehealth: Payer: Self-pay | Admitting: Family Medicine

## 2020-01-27 DIAGNOSIS — N63 Unspecified lump in unspecified breast: Secondary | ICD-10-CM

## 2020-01-27 NOTE — Telephone Encounter (Signed)
Copied from [place] [phone]. Topic: General - Other >> Jan 27, 2020  4:14 PM Sheran Luz wrote: Mariann Laster, with [facility] breast imaging center, states that they received a 9 page fax with the cover letter stating it's a medical records request but the fax itself was the patient's medical records. She inquired on what was actually meant to be sent.

## 2020-01-27 NOTE — Telephone Encounter (Signed)
Its retroareolar

## 2020-01-27 NOTE — Telephone Encounter (Signed)
I will need to know clock position of breast mass in order to schedule. [place] Poli will also want a diagnostic bilateral mammogram TOMO and ultrasound limited breast ultrasound of opposite breast,Thanks

## 2020-01-30 NOTE — Telephone Encounter (Signed)
Ok to order diagnostic mammagram

## 2020-01-30 NOTE — Telephone Encounter (Signed)
Referral placed.

## 2020-01-30 NOTE — Telephone Encounter (Signed)
Mariann Laster called back and the order for referral also needs to be a :"bilateral diagnostic mammogram" in addition to the ultrasound.   Both are done on men. Please send this order and they wil get the pt scheduled.  Mariann Laster cb  [phone]

## 2020-02-03 ENCOUNTER — Telehealth: Payer: Self-pay | Admitting: Family Medicine

## 2020-02-03 NOTE — Telephone Encounter (Signed)
I spoke with Parke Poisson and she advised me that she already faxed the orders.

## 2020-02-03 NOTE — Telephone Encounter (Signed)
[facility] is requesting an order for diagnostic bilateral mammogram.Thanks

## 2020-02-03 NOTE — Telephone Encounter (Signed)
Is this for [facility]?

## 2020-02-19 ENCOUNTER — Telehealth: Payer: Self-pay | Admitting: Family Medicine

## 2020-02-19 DIAGNOSIS — N62 Hypertrophy of breast: Secondary | ICD-10-CM

## 2020-02-19 NOTE — Telephone Encounter (Signed)
Mammogram just shows benign swelling of breast tissue, no signs of tumor or cancer. Sometimes this is a sign of hormone or pituitary abnormalities. Recommend checking total testosterone level, prolactin and LH level.

## 2020-02-19 NOTE — Telephone Encounter (Signed)
Patient advised and agrees to have labs done. Lab order placed up front for pick up.

## 2020-02-25 LAB — PROLACTIN: Prolactin: 18.6 ng/mL — ABNORMAL HIGH (ref 4.0–15.2)

## 2020-02-25 LAB — TESTOSTERONE: Testosterone: 204 ng/dL — ABNORMAL LOW (ref 264–916)

## 2020-02-25 LAB — LUTEINIZING HORMONE: LH: 5.7 m[IU]/mL (ref 1.7–8.6)

## 2020-02-26 ENCOUNTER — Telehealth: Payer: Self-pay

## 2020-02-26 DIAGNOSIS — N62 Hypertrophy of breast: Secondary | ICD-10-CM

## 2020-02-26 NOTE — Telephone Encounter (Signed)
Patient advised as below. Patient verbalizes understanding and is in agreement with treatment plan.  

## 2020-02-26 NOTE — Telephone Encounter (Signed)
-----   Message from Birdie Sons, MD sent at 02/25/2020  4:45 PM EDT ----- Labs show low testosterone levels and slightly elevated prolactin levels, which cause gynecomastia (lump in his breast). He needs referral to endocrinology for further evaluation of this.

## 2020-03-25 NOTE — Progress Notes (Signed)
Trena Platt Cummings,acting as a scribe for Wilhemena Durie, MD.,have documented all relevant documentation on the behalf of Wilhemena Durie, MD,as directed by  Wilhemena Durie, MD while in the presence of Wilhemena Durie, MD.  Established patient visit   Patient: Andrew Villanueva   DOB: 09-May-1951   69 y.o. Male  MRN: [id number] Visit Date: 03/26/2020  Today's healthcare provider: Wilhemena Durie, MD   Chief Complaint  Patient presents with  . Dizziness  . Neck Pain   Subjective    Neck Pain  The current episode started 1 to 4 weeks ago. The problem occurs daily. The problem has been gradually worsening. The pain is present in the right side. The quality of the pain is described as aching and cramping. The pain is at a severity of 4/10. Nothing (yawning) aggravates the symptoms. Associated symptoms include syncope and a visual change. Pertinent negatives include no chest pain, fever, headaches, numbness, pain with swallowing, photophobia, tingling, trouble swallowing or weakness. He has tried nothing for the symptoms.    Patient also complains of dizziness, patient says it started a few years ago. Patient says when he has dizzy spells it feels like the whole room is spinning. Patient says when he puts pressure on the back of his head it helps.        Medications: No outpatient medications prior to visit.   No facility-administered medications prior to visit.    Review of Systems  Constitutional: Negative for appetite change, chills and fever.  HENT: Negative for trouble swallowing.   Eyes: Negative for photophobia.  Respiratory: Negative for chest tightness, shortness of breath and wheezing.   Cardiovascular: Positive for syncope. Negative for chest pain and palpitations.  Gastrointestinal: Negative for abdominal pain, nausea and vomiting.  Musculoskeletal: Positive for neck pain.  Neurological: Negative for tingling, weakness, numbness and headaches.        Objective    BP (!) 153/89 (BP Location: Left Arm, Patient Position: Sitting, Cuff Size: Large)   Pulse 61   Temp 98 F (36.7 C) (Oral)   Ht 6\' 1"  (1.854 m)   Wt 223 lb (101.2 kg)   BMI 29.42 kg/m  BP Readings from Last 3 Encounters:  03/26/20 (!) 153/89  01/20/20 126/80  01/18/19 126/83   Wt Readings from Last 3 Encounters:  03/26/20 223 lb (101.2 kg)  01/20/20 219 lb (99.3 kg)  01/18/19 204 lb (92.5 kg)      Physical Exam Vitals reviewed.  Constitutional:      Appearance: Normal appearance.  HENT:     Head: Normocephalic and atraumatic.     Right Ear: External ear normal.     Left Ear: External ear normal.  Eyes:     General: No scleral icterus.    Conjunctiva/sclera: Conjunctivae normal.  Cardiovascular:     Rate and Rhythm: Normal rate and regular rhythm.     Pulses: Normal pulses.     Heart sounds: Normal heart sounds.  Pulmonary:     Effort: Pulmonary effort is normal.     Breath sounds: Normal breath sounds.  Skin:    General: Skin is warm and dry.  Neurological:     General: No focal deficit present.     Mental Status: He is alert and oriented to person, place, and time.     Comments: Mild nystagmus.  Psychiatric:        Mood and Affect: Mood normal.  Behavior: Behavior normal.        Thought Content: Thought content normal.        Judgment: Judgment normal.       No results found for any visits on 03/26/20.  Assessment & Plan     1. Vertigo  - meclizine (ANTIVERT) 25 MG tablet; Take 1 tablet (25 mg total) by mouth 3 (three) times daily as needed for dizziness.  Dispense: 30 tablet; Refill: 1  2. Neck pain Torticollis versus DDD versus osteoarthritis.  Start with NSAID - celecoxib (CELEBREX) 200 MG capsule; Take 1 capsule (200 mg total) by mouth daily as needed.  Dispense: 30 capsule; Refill: 0 3.  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         Jermiya Reichl Cranford Mon, MD  [facility] [phone]  (phone) [phone] (fax)  [place]

## 2020-03-26 ENCOUNTER — Ambulatory Visit (INDEPENDENT_AMBULATORY_CARE_PROVIDER_SITE_OTHER): Payer: Managed Care, Other (non HMO) | Admitting: Family Medicine

## 2020-03-26 ENCOUNTER — Encounter: Payer: Self-pay | Admitting: Family Medicine

## 2020-03-26 ENCOUNTER — Other Ambulatory Visit: Payer: Self-pay

## 2020-03-26 VITALS — BP 153/89 | HR 61 | Temp 98.0°F | Ht 73.0 in | Wt 223.0 lb

## 2020-03-26 DIAGNOSIS — C8333 Diffuse large B-cell lymphoma, intra-abdominal lymph nodes: Secondary | ICD-10-CM | POA: Diagnosis not present

## 2020-03-26 DIAGNOSIS — R42 Dizziness and giddiness: Secondary | ICD-10-CM | POA: Diagnosis not present

## 2020-03-26 DIAGNOSIS — M542 Cervicalgia: Secondary | ICD-10-CM

## 2020-03-26 MED ORDER — CELECOXIB 200 MG PO CAPS: 200.0000 mg | ORAL_CAPSULE | Freq: Every day | ORAL | 0 refills | Status: DC | PRN

## 2020-03-26 MED ORDER — MECLIZINE HCL 25 MG PO TABS: 25.0000 mg | ORAL_TABLET | Freq: Three times a day (TID) | ORAL | 1 refills | Status: DC | PRN

## 2020-04-14 DIAGNOSIS — N62 Hypertrophy of breast: Secondary | ICD-10-CM | POA: Insufficient documentation

## 2020-04-14 DIAGNOSIS — E041 Nontoxic single thyroid nodule: Secondary | ICD-10-CM | POA: Insufficient documentation

## 2020-04-23 ENCOUNTER — Encounter: Payer: Self-pay | Admitting: Family Medicine

## 2020-04-23 DIAGNOSIS — E221 Hyperprolactinemia: Secondary | ICD-10-CM | POA: Insufficient documentation

## 2020-04-23 HISTORY — DX: Hyperprolactinemia: E22.1

## 2020-06-11 ENCOUNTER — Other Ambulatory Visit: Payer: Self-pay

## 2020-06-11 ENCOUNTER — Ambulatory Visit: Payer: Managed Care, Other (non HMO)

## 2020-06-11 DIAGNOSIS — Z23 Encounter for immunization: Secondary | ICD-10-CM | POA: Diagnosis not present

## 2020-07-01 IMAGING — US US ABDOMEN LIMITED
1 series · 14 of 25 positions shown · non-contrast
Comparison: None.

CLINICAL DATA: Epigastric pain.  Nausea since 2 a.m..

EXAM:
ULTRASOUND ABDOMEN LIMITED RIGHT UPPER QUADRANT

[Series 1: us abdomen limited · 0.24mm/px · 14 of 53 slices shown]
[im 1/53]
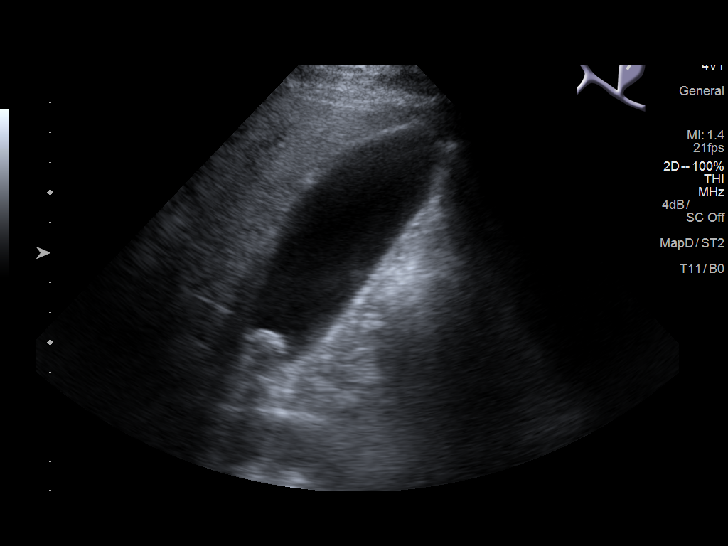
[im 5/53]
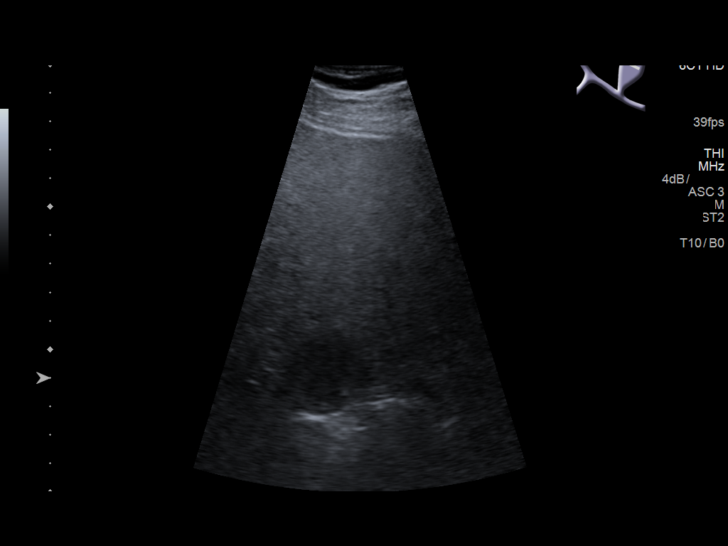
[im 9/53]
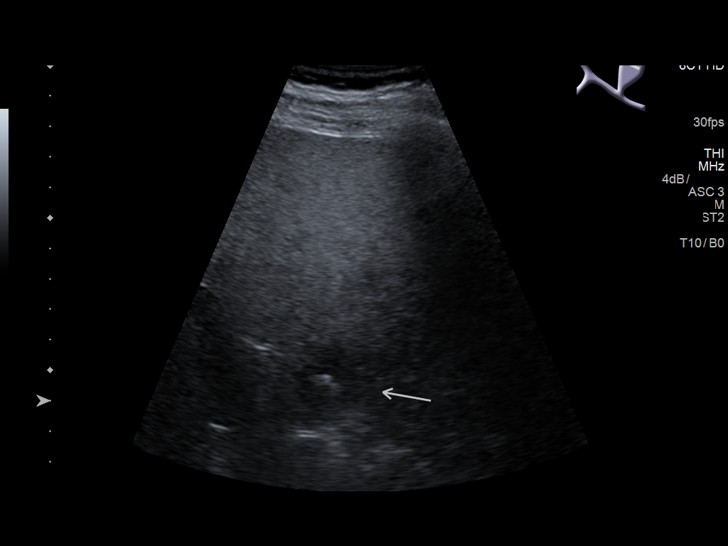
[im 14/53]
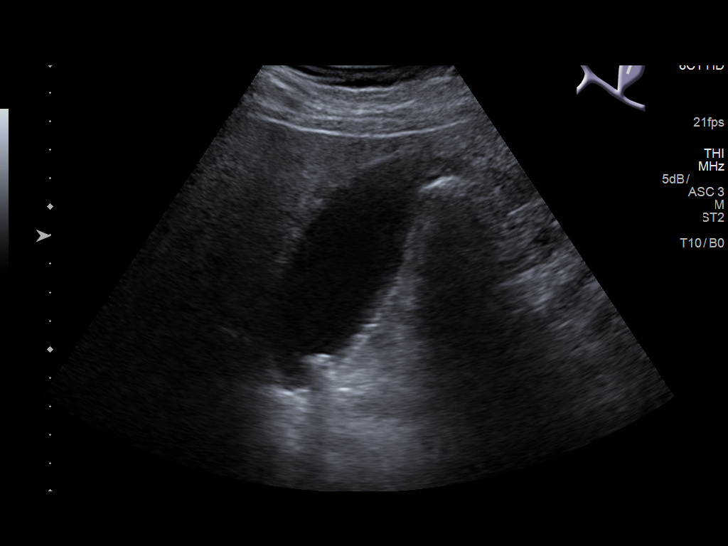
[im 18/53]
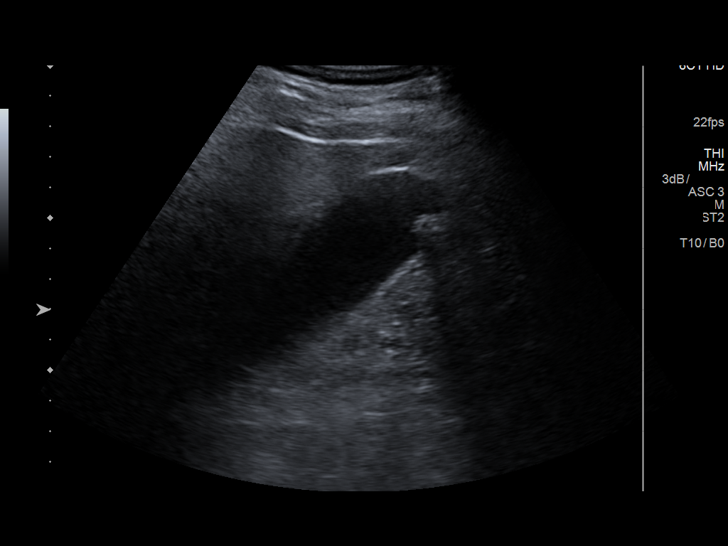
[im 20/53]
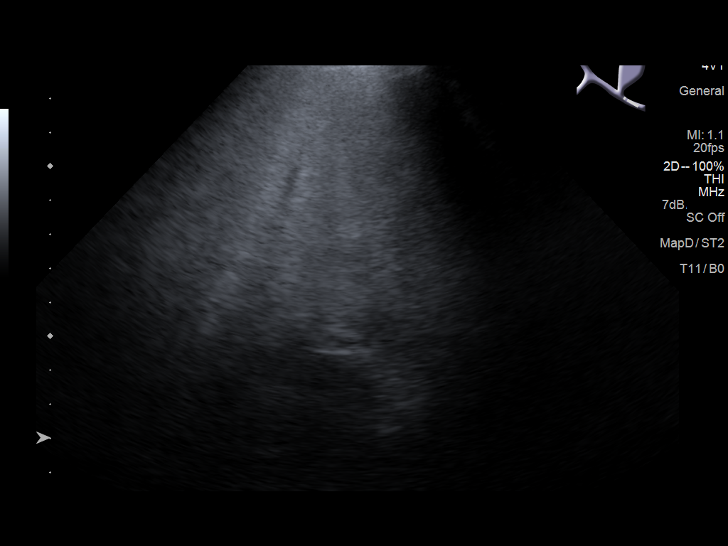
[im 24/53]
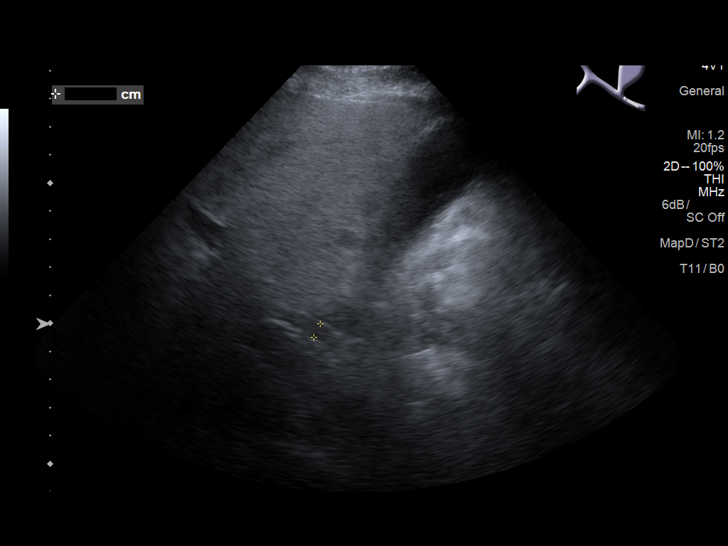
[im 29/53]
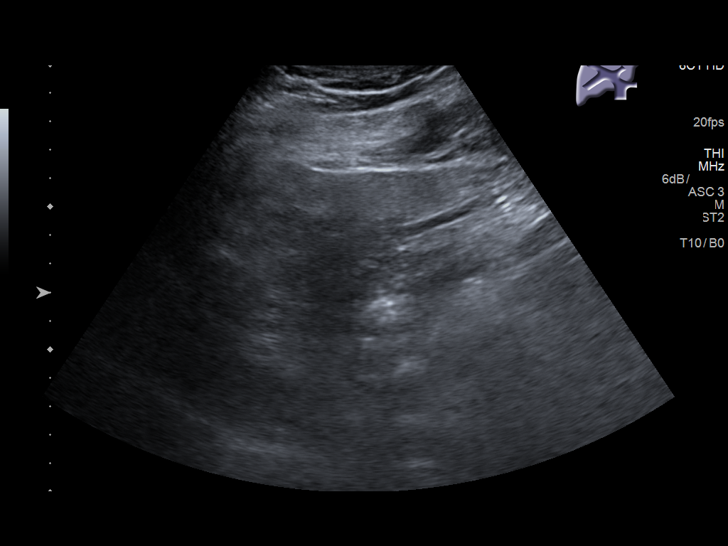
[im 33/53]
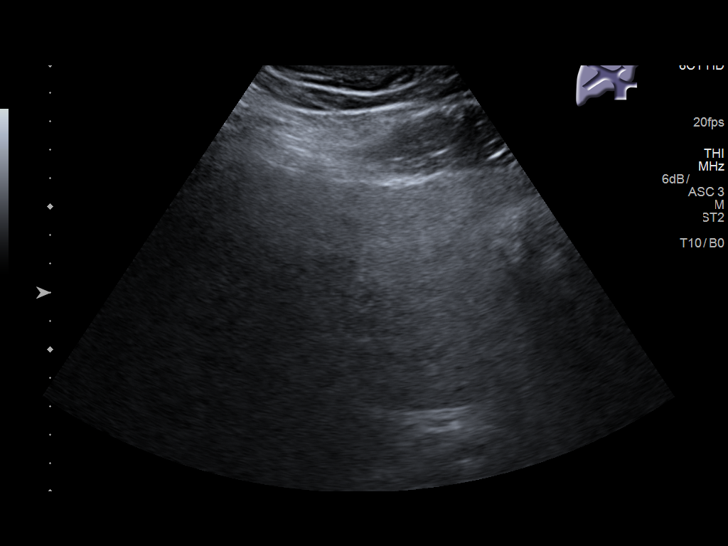
[im 35/53]
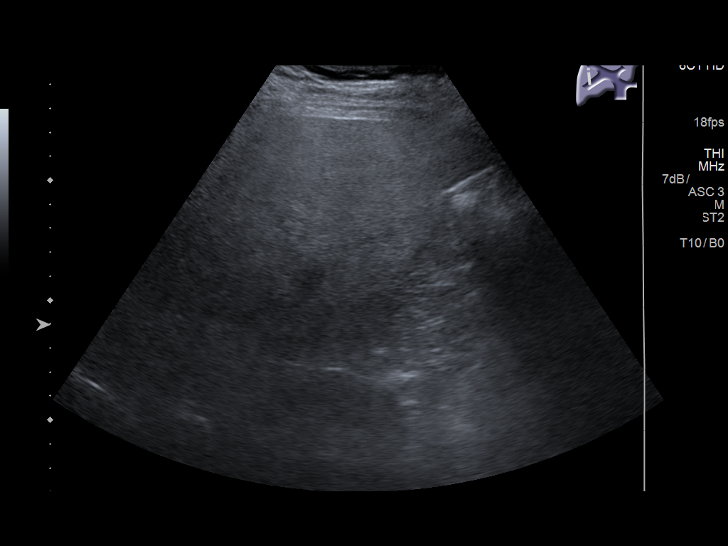
[im 40/53]
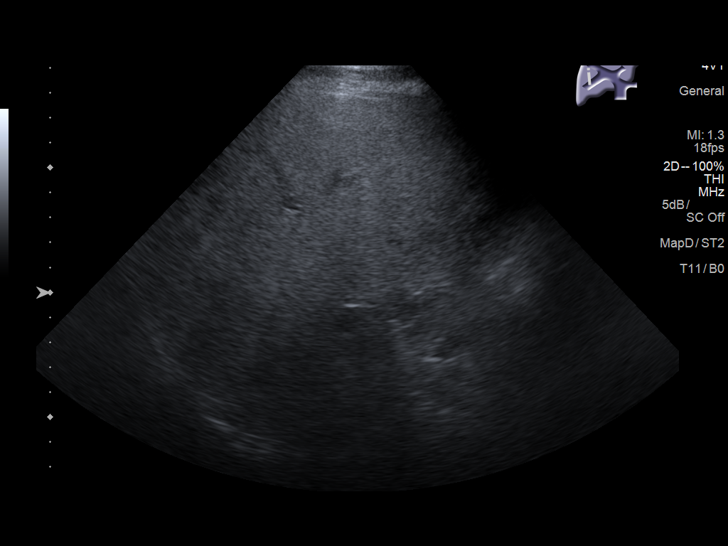
[im 44/53]
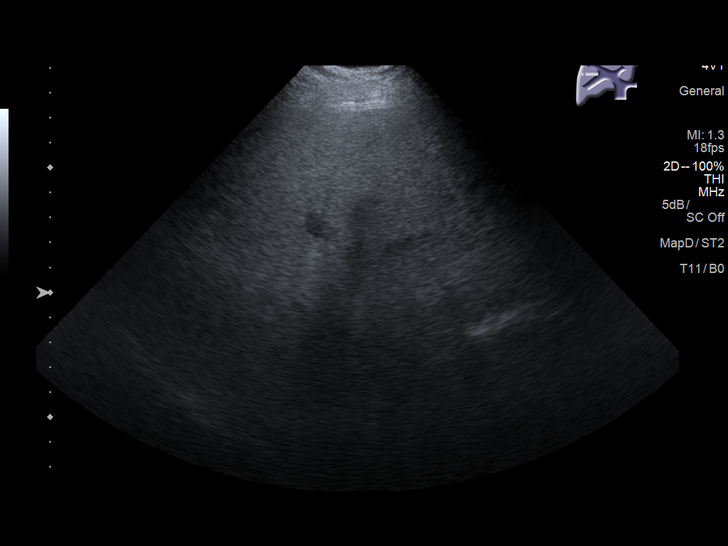
[im 48/53]
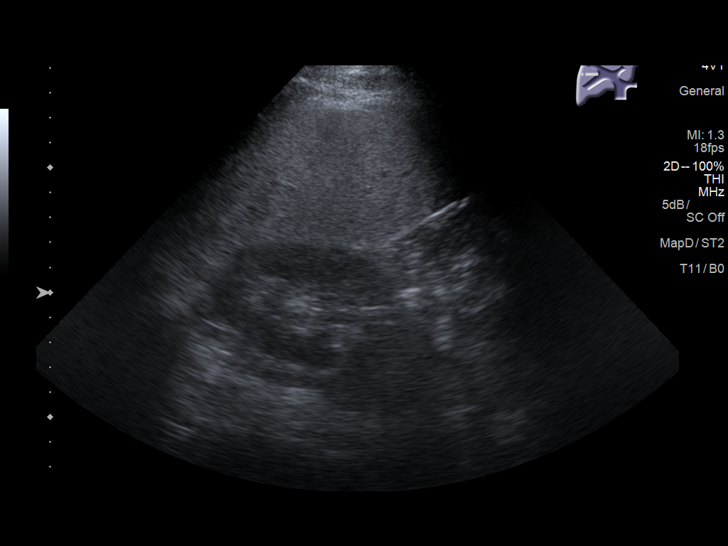
[im 53/53]
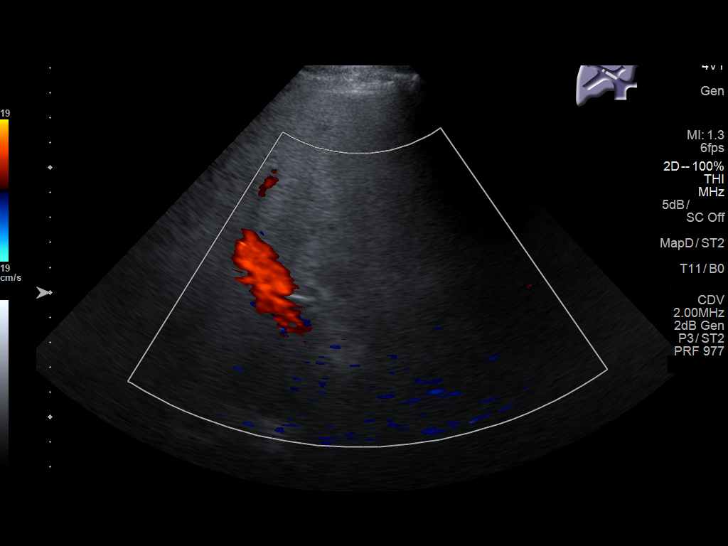

[14 of 25 positions shown; findings below may reference images not displayed]

FINDINGS: Gallbladder:

Gallbladder wall is 3.5 millimeters in thickness. A single mobile
stone measures 13 millimeters. No sonographic Murphy sign. No
pericholecystic fluid.

Common bile duct:

Diameter: 4.6 millimeters

Liver:

Liver parenchyma is diffusely echogenic without focal mass. Portal
vein is patent on color Doppler imaging with normal direction of
blood flow towards the liver.
IMPRESSION: 1. Nonspecific gallbladder wall thickening and gallstone without
other evidence for acute cholecystitis.
2. Hepatic steatosis.

## 2021-01-20 ENCOUNTER — Encounter: Payer: Self-pay | Admitting: Family Medicine

## 2021-01-20 ENCOUNTER — Other Ambulatory Visit: Payer: Self-pay

## 2021-01-20 ENCOUNTER — Ambulatory Visit (INDEPENDENT_AMBULATORY_CARE_PROVIDER_SITE_OTHER): Payer: Managed Care, Other (non HMO) | Admitting: Family Medicine

## 2021-01-20 VITALS — BP 142/91 | HR 67 | Ht 73.0 in | Wt 231.0 lb

## 2021-01-20 DIAGNOSIS — Z125 Encounter for screening for malignant neoplasm of prostate: Secondary | ICD-10-CM | POA: Diagnosis not present

## 2021-01-20 DIAGNOSIS — Z0000 Encounter for general adult medical examination without abnormal findings: Secondary | ICD-10-CM | POA: Diagnosis not present

## 2021-01-20 NOTE — Progress Notes (Signed)
Annual Physical     Patient: Andrew Villanueva, Male    DOB: 1951-05-15, 70 y.o.   MRN: [id number] Visit Date: 01/20/2021  Today's Provider: Lelon Huh, MD   No chief complaint on file.  Subjective    Andrew Villanueva is a 70 y.o. male who presents today for his Annual Physical He reports consuming a general diet. The patient does not participate in regular exercise at present. He generally feels well. He reports sleeping well. He does not have additional problems to discuss today. He is going on Medicare in July.      Medications: Outpatient Medications Prior to Visit  Medication Sig   celecoxib (CELEBREX) 200 MG capsule Take 1 capsule (200 mg total) by mouth daily as needed. (Patient not taking: Reported on 01/20/2021)   meclizine (ANTIVERT) 25 MG tablet Take 1 tablet (25 mg total) by mouth 3 (three) times daily as needed for dizziness. (Patient not taking: Reported on 01/20/2021)   No facility-administered medications prior to visit.    No Known Allergies  Patient Care Team: Birdie Sons, MD as PCP - General (Family Medicine) Bridget Hartshorn, Noel Journey, MD as Referring Physician (Dermatology) Larina Earthly, MD as Referring Physician (Hematology and Oncology)  Review of Systems  Constitutional: Negative.   HENT: Negative.    Eyes: Negative.   Respiratory: Negative.    Cardiovascular: Negative.   Gastrointestinal: Negative.   Endocrine: Negative.   Genitourinary: Negative.   Musculoskeletal: Negative.   Skin: Negative.   Allergic/Immunologic: Negative.   Hematological: Negative.   Psychiatric/Behavioral: Negative.         Objective    Vitals: BP (!) 142/91 (BP Location: Right Arm, Patient Position: Sitting, Cuff Size: Large)   Pulse 67   Ht 6\' 1"  (1.854 m)   Wt 231 lb (104.8 kg)   SpO2 98%   BMI 30.48 kg/m    Physical Exam  General Appearance:    Overweight male. Alert, cooperative, in no acute distress, appears stated age  Head:     Normocephalic, without obvious abnormality, atraumatic  Eyes:    PERRL, conjunctiva/corneas clear, EOM's intact, fundi    benign, both eyes       Ears:    Normal TM's and external ear canals, both ears  Neck:   Supple, symmetrical, trachea midline, no adenopathy;       thyroid:  No enlargement/tenderness/nodules; no carotid   bruit or JVD  Back:     Symmetric, no curvature, ROM normal, no CVA tenderness  Lungs:     Clear to auscultation bilaterally, respirations unlabored  Chest wall:    No tenderness or deformity  Heart:    Normal heart rate. Normal rhythm. No murmurs, rubs, or gallops.  S1 and S2 normal  Abdomen:     Soft, non-tender, bowel sounds active all four quadrants,    no masses, no organomegaly  Genitalia:    deferred  Rectal:    deferred  Extremities:   All extremities are intact. No cyanosis or edema  Pulses:   2+ and symmetric all extremities  Skin:   Skin color, texture, turgor normal, no rashes or lesions  Lymph nodes:   Cervical, supraclavicular, and axillary nodes normal  Neurologic:   CNII-XII intact. Normal strength, sensation and reflexes      throughout     Most recent functional status assessment: In your present state of health, do you have any difficulty performing the following activities: 01/20/2021  Hearing? N  Vision?  N  Difficulty concentrating or making decisions? N  Walking or climbing stairs? N  Dressing or bathing? N  Doing errands, shopping? N  Some recent data might be hidden   Most recent fall risk assessment: Fall Risk  01/20/2021  Falls in the past year? 0  Number falls in past yr: 0  Injury with Fall? 0  Risk for fall due to : No Fall Risks  Follow up Falls evaluation completed    Most recent depression screenings: PHQ 2/9 Scores 01/20/2021 01/20/2020  PHQ - 2 Score 0 0  PHQ- 9 Score 0 1   Most recent cognitive screening: 6CIT Screen 01/20/2021  What Year? 0 points  What month? 0 points  What time? 0 points  Count back from 20 0  points  Months in reverse 2 points  Repeat phrase 4 points  Total Score 6   Most recent Audit-C alcohol use screening Alcohol Use Disorder Test (AUDIT) 01/20/2021  1. How often do you have a drink containing alcohol? 2  2. How many drinks containing alcohol do you have on a typical day when you are drinking? 0  3. How often do you have six or more drinks on one occasion? 0  AUDIT-C Score 2   A score of 3 or more in women, and 4 or more in men indicates increased risk for alcohol abuse, EXCEPT if all of the points are from question 1   No results found for any visits on 01/20/21.  Assessment & Plan     Complete physical done today including the all of the following: Reviewed patient's Family Medical History Reviewed and updated list of patient's medical providers Assessment of cognitive impairment was done Assessed patient's functional ability Established a written schedule for health screening [place] Completed and Reviewed -PSA ordered  Exercise Activities and Dietary recommendations  Goals   None     Immunization History  Administered Date(s) Administered   Fluad Quad(high Dose 65+) 05/31/2019   Influenza,inj,Quad PF,6+ Mos 07/09/2015, 06/15/2018, 06/11/2020   Moderna Sars-Covid-2 Vaccination 09/03/2019, 10/01/2019   Pneumococcal Conjugate-13 11/21/2016   Pneumococcal Polysaccharide-23 12/04/2017   Tdap 10/25/2006, 04/01/2015   Zoster, Live 11/07/2015    Health Maintenance  Topic Date Due   Zoster Vaccines- Shingrix (1 of 2) Never done   COVID-19 Vaccine (3 - Moderna risk series) 10/29/2019   INFLUENZA VACCINE  03/08/2021   COLONOSCOPY (Pts 45-15yrs Insurance coverage will need to be confirmed)  01/23/2022   TETANUS/TDAP  03/31/2025   Hepatitis C Screening  Completed   PNA vac Low Risk Adult  Completed   HPV VACCINES  Aged Out   He was strongly encouraged to get a Covid booster. He declined today but states he and his wife plan on getting  Moderna booster soon. Recommended Shingrix vaccine which is going to think about.   Discussed health benefits of physical activity, and encouraged him to engage in regular exercise appropriate for his age and condition.           The entirety of the information documented in the History of Present Illness, Review of Systems and Physical Exam were personally obtained by me. Portions of this information were initially documented by the CMA and reviewed by me for thoroughness and accuracy.     Lelon Huh, MD  [facility] [phone] (phone) [phone] (fax)  [place]

## 2021-01-20 NOTE — Patient Instructions (Addendum)
I strongly recommend at least one booster dose (a third shot) of the Covid vaccine for all adults. People at high risk for serious Covid infections should have a second booster dose 4-6 months after the first booster.   The CDC recommends two doses of Shingrix (the shingles vaccine) separated by 2 to 6 months for adults age 70 years and older. I recommend checking with your insurance plan regarding coverage for this vaccine.

## 2021-01-21 LAB — PSA TOTAL (REFLEX TO FREE): Prostate Specific Ag, Serum: 0.5 ng/mL (ref 0.0–4.0)

## 2021-03-15 ENCOUNTER — Ambulatory Visit: Payer: Self-pay | Admitting: *Deleted

## 2021-03-15 NOTE — Telephone Encounter (Signed)
Reason for Disposition  Systolic BP  >= 0000000 OR Diastolic >= 123XX123  Answer Assessment - Initial Assessment Questions 1. BLOOD PRESSURE: "What is the blood pressure?" "Did you take at least two measurements 5 minutes apart?"     154/95, 152/105 2. ONSET: "When did you take your blood pressure?"     4:30. 4:37 3. HOW: "How did you obtain the blood pressure?" (e.g., visiting nurse, automatic home BP monitor)     Automatic- arm 4. HISTORY: "Do you have a history of high blood pressure?"    No- borderline 5. MEDICATIONS: "Are you taking any medications for blood pressure?" "Have you missed any doses recently?"     no 6. OTHER SYMPTOMS: "Do you have any symptoms?" (e.g., headache, chest pain, blurred vision, difficulty breathing, weakness)     Nausea- on/off- 2 months 7. PREGNANCY: "Is there any chance you are pregnant?" "When was your last menstrual period?"     N/a  Protocols used: Blood Pressure - High-A-AH

## 2021-03-15 NOTE — Telephone Encounter (Signed)
Patient is calling to report he has been having elevated BP readings for the last few days- ranging- 140/90. Today his BP is a little higher. Patient is presently out of town- at [employer]- but states he can come back anytime to be seen- he just has to have travel time. Patient advised provider may not want to do new start BP medication with virtual visit- will send message to provider to see what he wants patient to do- not scheduled to come back until 8/20- but will return anytime for appointment. ( Patient just retired - contact number corrected).

## 2021-03-16 ENCOUNTER — Telehealth: Payer: Self-pay

## 2021-03-16 MED ORDER — AMLODIPINE BESYLATE 5 MG PO TABS: 5.0000 mg | ORAL_TABLET | Freq: Every day | ORAL | 1 refills | Status: DC

## 2021-03-16 NOTE — Telephone Encounter (Signed)
Can start amlodipine '5mg'$  one tablet daily, #30, rf x 0 and given him next open slot on schedule

## 2021-03-16 NOTE — Telephone Encounter (Signed)
Advised patient as below. Medication sent into the pharmacy. Appt scheduled.

## 2021-03-16 NOTE — Telephone Encounter (Signed)
Copied from [place] [id number]. Topic: Appointment Scheduling - Scheduling Inquiry for Clinic >> Mar 16, 2021 12:54 PM Bayard Beaver wrote: Reason for CRM: patient says was told he would be worked in over the next 3 days for bp appt. I dont have naything unitl 09/06. Please call back

## 2021-03-16 NOTE — Addendum Note (Signed)
Addended by: Wilburt Finlay on: 03/16/2021 04:03 PM   Modules accepted: Orders

## 2021-04-07 ENCOUNTER — Other Ambulatory Visit: Payer: Self-pay | Admitting: Family Medicine

## 2021-04-07 NOTE — Telephone Encounter (Signed)
Call to pharmacy- slightly early for RF- they are set up for RF in few days. Call to patient- he states he is good and does not need the RF.

## 2021-04-27 ENCOUNTER — Ambulatory Visit (INDEPENDENT_AMBULATORY_CARE_PROVIDER_SITE_OTHER): Payer: Medicare HMO | Admitting: Family Medicine

## 2021-04-27 ENCOUNTER — Other Ambulatory Visit: Payer: Self-pay

## 2021-04-27 ENCOUNTER — Encounter: Payer: Self-pay | Admitting: Family Medicine

## 2021-04-27 VITALS — BP 136/85 | HR 60 | Resp 18 | Wt 231.0 lb

## 2021-04-27 DIAGNOSIS — R030 Elevated blood-pressure reading, without diagnosis of hypertension: Secondary | ICD-10-CM | POA: Diagnosis not present

## 2021-04-27 NOTE — Progress Notes (Signed)
Established patient visit   Patient: Andrew Villanueva   DOB: 06-03-1951   70 y.o. Male  MRN: [id number] Visit Date: 04/27/2021  Today's healthcare provider: Lelon Huh, MD   Chief Complaint  Patient presents with   Hypertension   Subjective    HPI  Hypertension, follow-up  BP Readings from Last 3 Encounters:  04/27/21 136/85  01/20/21 (!) 142/91  03/26/20 (!) 153/89   Wt Readings from Last 3 Encounters:  04/27/21 231 lb (104.8 kg)  01/20/21 231 lb (104.8 kg)  03/26/20 223 lb (101.2 kg)     Patient was prescribed Amlodipine 5mg  daily on 03/16/2021 due to dizziness and elevated home blood pressures.    He reports poor compliance with treatment. However he has been much more strict about salt intact, exercising more and working on losing weight since then. Is no longer having any dizziness. Patient has not started taking yet. He states his blood pressure started to come back down, but has recently started to go back up.  He is not having side effects.  He is following a Regular diet. He is not exercising. He does not smoke.  Use of agents associated with hypertension: none.   Outside blood pressures are averaging 140/ 86. Symptoms: No chest pain No chest pressure  No palpitations No syncope  No dyspnea No orthopnea  No paroxysmal nocturnal dyspnea No lower extremity edema   Pertinent labs: Lab Results  Component Value Date   CHOL 154 06/03/2019   HDL 66 06/03/2019   LDLCALC 73 06/03/2019   TRIG 81 06/03/2019   CHOLHDL 2.3 06/03/2019   Lab Results  Component Value Date   NA 143 09/21/2018   K 4.4 09/21/2018   CREATININE 0.76 09/21/2018   GFRNONAA 94 09/21/2018   GFRAA 109 09/21/2018   GLUCOSE 109 (H) 09/21/2018     The 10-year ASCVD risk score (Arnett DK, et al., 2019) is: 14.3%   ---------------------------------------------------------------------------------------------------     Medications: Outpatient Medications Prior to Visit   Medication Sig   amLODipine (NORVASC) 5 MG tablet Take 1 tablet (5 mg total) by mouth daily. (Patient not taking: Reported on 04/27/2021)   No facility-administered medications prior to visit.    Review of Systems  Constitutional:  Negative for appetite change, chills and fever.  Respiratory:  Negative for chest tightness, shortness of breath and wheezing.   Cardiovascular:  Negative for chest pain and palpitations.  Gastrointestinal:  Negative for abdominal pain, nausea and vomiting.  Neurological:  Positive for numbness (in feet).      Objective    BP 136/85 (BP Location: Right Arm, Patient Position: Sitting, Cuff Size: Normal)   Pulse 60   Resp 18   Wt 231 lb (104.8 kg)   BMI 30.48 kg/m  {Show previous vital signs (optional):23777}  Physical Exam   General appearance: Mildly obese male, cooperative and in no acute distress Head: Normocephalic, without obvious abnormality, atraumatic Respiratory: Respirations even and unlabored, normal respiratory rate Extremities: All extremities are intact.  Skin: Skin color, texture, turgor normal. No rashes seen  Psych: Appropriate mood and affect. Neurologic: Mental status: Alert, oriented to person, place, and time, thought content appropriate.     Assessment & Plan     1. Transient elevated blood pressure Has since made improvements to diet and exercise routines and has not started medications since BP has improved. He will continue with lifestyle modifications and follow up for CPE in about 4 months.   He anticipates  getting flu shot at his pharmacy.           The entirety of the information documented in the History of Present Illness, Review of Systems and Physical Exam were personally obtained by me. Portions of this information were initially documented by the CMA and reviewed by me for thoroughness and accuracy.     Lelon Huh, MD  [facility] [phone] (phone) [phone] (fax)  [place]

## 2021-04-27 NOTE — Patient Instructions (Addendum)
Please review the attached list of medications and notify my office if there are any errors.   I recommend that you get a flu vaccine this year. Please call our office at [phone] at your earliest convenience to schedule a flu shot.   I strongly recommend a Covid Omicron (bivalent Covid) booster if it's been more than 2 months since your last covid booster or infection.

## 2021-06-02 ENCOUNTER — Other Ambulatory Visit: Payer: Self-pay

## 2021-06-02 ENCOUNTER — Ambulatory Visit (INDEPENDENT_AMBULATORY_CARE_PROVIDER_SITE_OTHER): Payer: Medicare HMO

## 2021-06-02 DIAGNOSIS — Z23 Encounter for immunization: Secondary | ICD-10-CM | POA: Diagnosis not present

## 2021-09-01 ENCOUNTER — Other Ambulatory Visit: Payer: Self-pay

## 2021-09-01 ENCOUNTER — Encounter: Payer: Self-pay | Admitting: Family Medicine

## 2021-09-01 ENCOUNTER — Ambulatory Visit (INDEPENDENT_AMBULATORY_CARE_PROVIDER_SITE_OTHER): Payer: Medicare HMO | Admitting: Family Medicine

## 2021-09-01 VITALS — BP 151/87 | HR 67 | Temp 97.8°F | Resp 16 | Wt 227.0 lb

## 2021-09-01 DIAGNOSIS — Z9049 Acquired absence of other specified parts of digestive tract: Secondary | ICD-10-CM | POA: Diagnosis not present

## 2021-09-01 DIAGNOSIS — I10 Essential (primary) hypertension: Secondary | ICD-10-CM

## 2021-09-01 MED ORDER — AMLODIPINE BESYLATE 5 MG PO TABS: 5.0000 mg | ORAL_TABLET | Freq: Every day | ORAL | 4 refills | Status: DC

## 2021-09-01 NOTE — Patient Instructions (Signed)
.   Please review the attached list of medications and notify my office if there are any errors.   . Please bring all of your medications to every appointment so we can make sure that our medication list is the same as yours.   

## 2021-09-01 NOTE — Progress Notes (Signed)
Established patient visit   Patient: Andrew Villanueva   DOB: 10/30/1950   71 y.o. Male  MRN: [id number] Visit Date: 09/01/2021  Today's healthcare provider: Lelon Huh, MD   Chief Complaint  Patient presents with   Blood Pressure Check   Subjective    HPI  Follow up for Transient elevated blood pressure:  The patient was last seen for this 4 months ago. Changes made at last visit include none; continue with lifestyle modifications . He had been prescribed amlodipine prior to that and filled prescription, but never started medication.  He reports poor compliance with treatment. He has not been exercising or monitoring his diet.  He feels that condition is Unchanged. He is not having side effects.  Home blood pressures have averages 133/92. -----------------------------------------------------------------------------------------   Medications: No outpatient medications prior to visit.   No facility-administered medications prior to visit.    Review of Systems  Constitutional:  Negative for appetite change, chills and fever.  HENT:  Positive for ear pain (occasional pain behind right ear).   Respiratory:  Positive for shortness of breath. Negative for chest tightness and wheezing.   Cardiovascular:  Negative for chest pain and palpitations.  Gastrointestinal:  Negative for abdominal pain, nausea and vomiting.      Objective    BP (!) 151/87 (BP Location: Right Arm, Patient Position: Sitting, Cuff Size: Large)    Pulse 67    Temp 97.8 F (36.6 C) (Oral)    Resp 16    Wt 227 lb (103 kg)    SpO2 100% Comment: room air   BMI 29.95 kg/m  {Show previous vital signs (optional):23777} Today's Vitals   09/01/21 0810 09/01/21 0813  BP: (!) 153/90 (!) 151/87  Pulse: 67   Resp: 16   Temp: 97.8 F (36.6 C)   TempSrc: Oral   SpO2: 100%   Weight: 227 lb (103 kg)    Body mass index is 29.95 kg/m.    Physical Exam  General appearance:  Well developed, well  nourished male, cooperative and in no acute distress Head: Normocephalic, without obvious abnormality, atraumatic Respiratory: Respirations even and unlabored, normal respiratory rate Extremities: All extremities are intact.  Skin: Skin color, texture, turgor normal. No rashes seen  Psych: Appropriate mood and affect. Neurologic: Mental status: Alert, oriented to person, place, and time, thought content appropriate.    Assessment & Plan     1. Primary hypertension Is not improved with lifestyle management. He never started amlodipine that was previously prescribed. Will go ahead and start amLODipine (NORVASC) 5 MG tablet; Take 1 tablet (5 mg total) by mouth daily.  Dispense: 30 tablet; Refill: 4  Follow up at previously scheduled CPE in June of thisyear.   2. 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 Consider that was done just 2 years after the adenomas were removed by Dr. Allen Norris, I think he should continue Dr. Dorothey Baseman original recommendation to have colonoscopy done every 5 years..        The entirety of the information documented in the History of Present Illness, Review of Systems and Physical Exam were personally obtained by me. Portions of this information were initially documented by the CMA and reviewed by me for thoroughness and accuracy.     Lelon Huh, MD  [facility] [phone] (phone) [phone] (fax)  [place]

## 2021-11-08 ENCOUNTER — Ambulatory Visit (INDEPENDENT_AMBULATORY_CARE_PROVIDER_SITE_OTHER): Payer: Medicare HMO

## 2021-11-08 VITALS — Wt 227.0 lb

## 2021-11-08 DIAGNOSIS — Z0000 Encounter for general adult medical examination without abnormal findings: Secondary | ICD-10-CM

## 2021-11-08 NOTE — Progress Notes (Signed)
?Virtual Visit via Telephone Note ? ?I connected with  Andrew Villanueva on 11/08/21 at  2:45 PM EDT by telephone and verified that I am speaking with the correct person using two identifiers. ? ?Location: ?Patient: home ?Provider: [facility] ?Persons participating in the virtual visit: patient/Nurse Health Advisor ?  ?I discussed the limitations, risks, security and privacy concerns of performing an evaluation and management service by telephone and the availability of in person appointments. The patient expressed understanding and agreed to proceed. ? ?Interactive audio and video telecommunications were attempted between this nurse and patient, however failed, due to patient having technical difficulties OR patient did not have access to video capability.  We continued and completed visit with audio only. ? ?Some vital signs may be absent or patient reported.  ? ?Andrew David, LPN ? ?Subjective:  ? Andrew Villanueva is a 71 y.o. male who presents for an Initial Medicare Annual Wellness Visit. ? ?Review of Systems    ? ?  ? ?   ?Objective:  ?  ?There were no vitals filed for this visit. ?There is no height or weight on file to calculate BMI. ? ? ?  05/30/2018  ?  6:15 AM 05/23/2018  ?  3:39 PM 05/07/2018  ?  3:05 PM 01/23/2017  ?  8:04 AM  ?Advanced Directives  ?Does Patient Have a Medical Advance Directive? No No No No  ?Would patient like information on creating a medical advance directive? No - Patient declined   Yes (MAU/Ambulatory/Procedural Areas - Information given)  ? ? ?Current Medications (verified) ?Outpatient Encounter Medications as of 11/08/2021  ?Medication Sig  ? amLODipine (NORVASC) 5 MG tablet Take 1 tablet (5 mg total) by mouth daily.  ? ?No facility-administered encounter medications on file as of 11/08/2021.  ? ? ?Allergies (verified) ?Patient has no known allergies.  ? ?History: ?Past Medical History:  ?Diagnosis Date  ? Arthritis   ? Calculus of bile duct without cholangitis or biliary obstruction   ?  Calculus of gallbladder without cholecystitis without obstruction 05/09/2018  ? Complication of anesthesia   ? 1ST COLONOSCOPY PT HAD TROUBLE WAKING UP   ? 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 terminal ileum, cecum, proximal ascending colon, s/p hemicolectomy 2020  ? GERD (gastroesophageal reflux disease)   ? OCC-TUMS PRN  ? History of measles   ? Wears dentures   ? upper partial  ? ?Past Surgical History:  ?Procedure Laterality Date  ? CHOLECYSTECTOMY N/A 05/30/2018  ? Procedure: LAPAROSCOPIC CHOLECYSTECTOMY;  Surgeon: Vickie Epley, MD;  Location: [facility];  Service: General;  Laterality: N/A;  ? COLONOSCOPY    ? COLONOSCOPY WITH PROPOFOL N/A 01/23/2017  ? Procedure: COLONOSCOPY WITH PROPOFOL;  Surgeon: Lucilla Lame, MD;  Location: [place];  Service: Endoscopy;  Laterality: N/A;  ? HERNIA REPAIR  09/2019  ? LAPAROSCOPIC PARTIAL COLECTOMY  12/20/2018  ? laparascopy, surgical, colectomy, partial with removal of terminal ileum with ileocolostomy. [facility] Dr. Victorio Palm  ? POLYPECTOMY  01/23/2017  ? Procedure: POLYPECTOMY;  Surgeon: Lucilla Lame, MD;  Location: [place];  Service: Endoscopy;;  ? ?Family History  ?Problem Relation Age of Onset  ? Diabetes Mother   ? Heart attack Mother   ? CAD Father   ? Diabetes Father   ? Lung cancer Brother   ? Colon cancer Neg Hx   ? Prostate cancer Neg Hx   ? ?Social History  ? ?Socioeconomic History  ? Marital status: Married  ?  Spouse name: Not on file  ? Number of children: 2  ? Years of education: Not on file  ? Highest education level: Not on file  ?Occupational History  ? Occupation: Associate Professor  ?Tobacco Use  ? Smoking status: Never  ? Smokeless tobacco: Never  ?Vaping Use  ? Vaping Use: Never used  ?Substance and Sexual Activity  ? Alcohol use: Yes  ?  Alcohol/week: 2.0 standard drinks  ?  Types: 2 Cans of beer per week  ?  Comment: minimal alcohol consumption  ? Drug use: No  ? Sexual activity: Not on file  ?Other Topics Concern  ? Not  on file  ?Social History Narrative  ? Not on file  ? ?Social Determinants of Health  ? ?Financial Resource Strain: Not on file  ?Food Insecurity: Not on file  ?Transportation Needs: Not on file  ?Physical Activity: Not on file  ?Stress: Not on file  ?Social Connections: Not on file  ? ? ?Tobacco Counseling ?Counseling given: Not Answered ? ? ?Clinical Intake: ? ?Pre-visit preparation completed: Yes ? ?Pain : No/denies pain ? ?  ? ?Diabetes: No ? ?How often do you need to have someone help you when you read instructions, pamphlets, or other written materials from your doctor or pharmacy?: 1 - Never ? ?Diabetic?no ? ?Interpreter Needed?: No ? ?Information entered by :: Kirke Shaggy, LPN ? ? ?Activities of Daily Living ? ?  01/20/2021  ?  9:24 AM  ?In your present state of health, do you have any difficulty performing the following activities:  ?Hearing? 0  ?Vision? 0  ?Difficulty concentrating or making decisions? 0  ?Walking or climbing stairs? 0  ?Dressing or bathing? 0  ?Doing errands, shopping? 0  ? ? ?Patient Care Team: ?Birdie Sons, MD as PCP - General (Family Medicine) ?Denzil Magnuson, MD as Referring Physician (Dermatology) ?Larina Earthly, MD as Referring Physician (Hematology and Oncology) ? ?Indicate any recent Medical Services you may have received from other than [facility] providers in the past year (date may be approximate). ? ?   ?Assessment:  ? This is a routine wellness examination for Andrew Villanueva. ? ?Hearing/Vision screen ?No results found. ? ?Dietary issues and exercise activities discussed: ?  ? ? Goals Addressed   ?None ?  ? ?Depression Screen ? ?  01/20/2021  ?  9:23 AM 01/20/2020  ?  8:32 AM 01/18/2019  ? 10:05 AM 01/01/2019  ?  8:18 AM 12/04/2017  ?  9:20 AM 11/21/2016  ?  9:11 AM 11/21/2016  ?  9:09 AM  ?PHQ 2/9 Scores  ?PHQ - 2 Score 0 0 1 0 0 0 0  ?PHQ- 9 Score 0 '1 3 2 '$ 0 1 1  ?  ?Fall Risk ? ?  01/20/2021  ?  9:23 AM 01/20/2020  ?  8:33 AM 01/18/2019  ? 10:05 AM 06/14/2018  ?  9:30 AM 12/04/2017   ?  9:20 AM  ?Fall Risk   ?Falls in the past year? 0 0 0 0 No  ?Number falls in past yr: 0 0     ?Injury with Fall? 0 0     ?Risk for fall due to : No Fall Risks      ?Follow up Falls evaluation completed Falls evaluation completed  Falls evaluation completed   ? ? ?FALL RISK PREVENTION PERTAINING TO THE HOME: ? ?Any stairs in or around the home? Yes  ?If so, are there any without handrails? No  ?Home free of loose  throw rugs in walkways, pet beds, electrical cords, etc? Yes  ?Adequate lighting in your home to reduce risk of falls? Yes  ? ?ASSISTIVE DEVICES UTILIZED TO PREVENT FALLS: ? ?Life alert? No  ?Use of a cane, walker or w/c? No  ?Grab bars in the bathroom? No  ?Shower chair or bench in shower? No  ?Elevated toilet seat or a handicapped toilet? Yes  ? ?Cognitive Function: ?  ?  ? ?  01/20/2021  ?  9:20 AM 01/20/2020  ?  8:27 AM  ?6CIT Screen  ?What Year? 0 points 0 points  ?What month? 0 points 0 points  ?What time? 0 points 0 points  ?Count back from 20 0 points 0 points  ?Months in reverse 2 points 0 points  ?Repeat phrase 4 points 2 points  ?Total Score 6 points 2 points  ? ? ?Immunizations ?Immunization History  ?Administered Date(s) Administered  ? Fluad Quad(high Dose 65+) 05/31/2019, 06/02/2021  ? Influenza,inj,Quad PF,6+ Mos 07/09/2015, 06/15/2018, 06/11/2020  ? Moderna Sars-Covid-2 Vaccination 09/03/2019, 10/01/2019  ? Pneumococcal Conjugate-13 11/21/2016  ? Pneumococcal Polysaccharide-23 12/04/2017  ? Tdap 10/25/2006, 04/01/2015  ? Zoster, Live 11/07/2015  ? ? ?TDAP status: Up to date ? ?Flu Vaccine status: Up to date ? ?Pneumococcal vaccine status: Up to date ? ?Covid-19 vaccine status: Completed vaccines ? ?Qualifies for Shingles Vaccine? Yes   ?Zostavax completed Yes   ?Shingrix Completed?: No.    Education has been provided regarding the importance of this vaccine. Patient has been advised to call insurance company to determine out of pocket expense if they have not yet received this vaccine.  Advised may also receive vaccine at local pharmacy or Health Dept. Verbalized acceptance and understanding. ? ?Screening Tests ?Health Maintenance  ?Topic Date Due  ? Zoster Vaccines- Shingrix (1 of 2) Never

## 2021-11-08 NOTE — Patient Instructions (Signed)
Andrew Villanueva , ?Thank you for taking time to come for your Medicare Wellness Visit. I appreciate your ongoing commitment to your health goals. Please review the following plan we discussed and let me know if I can assist you in the future.  ? ?Screening recommendations/referrals: ?Colonoscopy: 01/23/17 ?Recommended yearly ophthalmology/optometry visit for glaucoma screening and checkup ?Recommended yearly dental visit for hygiene and checkup ? ?Vaccinations: ?Influenza vaccine: 06/02/21 ?Pneumococcal vaccine: 12/04/17 ?Tdap vaccine: 04/01/15 ?Shingles vaccine: Zostavax 11/07/15   ?Covid-19: 09/03/19, 10/01/19 ? ?Advanced directives: no ? ?Conditions/risks identified: none ? ?Next appointment: Follow up in one year for your annual wellness visit. 11/14/22 @ 11am by phone ? ?Preventive Care 69 Years and Older, Male ?Preventive care refers to lifestyle choices and visits with your health care provider that can promote health and wellness. ?What does preventive care include? ?A yearly physical exam. This is also called an annual well check. ?Dental exams once or twice a year. ?Routine eye exams. Ask your health care provider how often you should have your eyes checked. ?Personal lifestyle choices, including: ?Daily care of your teeth and gums. ?Regular physical activity. ?Eating a healthy diet. ?Avoiding tobacco and drug use. ?Limiting alcohol use. ?Practicing safe sex. ?Taking low doses of aspirin every day. ?Taking vitamin and mineral supplements as recommended by your health care provider. ?What happens during an annual well check? ?The services and screenings done by your health care provider during your annual well check will depend on your age, overall health, lifestyle risk factors, and family history of disease. ?Counseling  ?Your health care provider may ask you questions about your: ?Alcohol use. ?Tobacco use. ?Drug use. ?Emotional well-being. ?Home and relationship well-being. ?Sexual activity. ?Eating habits. ?History  of falls. ?Memory and ability to understand (cognition). ?Work and work Statistician. ?Screening  ?You may have the following tests or measurements: ?Height, weight, and BMI. ?Blood pressure. ?Lipid and cholesterol levels. These may be checked every 5 years, or more frequently if you are over 15 years old. ?Skin check. ?Lung cancer screening. You may have this screening every year starting at age 54 if you have a 30-pack-year history of smoking and currently smoke or have quit within the past 15 years. ?Fecal occult blood test (FOBT) of the stool. You may have this test every year starting at age 73. ?Flexible sigmoidoscopy or colonoscopy. You may have a sigmoidoscopy every 5 years or a colonoscopy every 10 years starting at age 56. ?Prostate cancer screening. Recommendations will vary depending on your family history and other risks. ?Hepatitis C blood test. ?Hepatitis B blood test. ?Sexually transmitted disease (STD) testing. ?Diabetes screening. This is done by checking your blood sugar (glucose) after you have not eaten for a while (fasting). You may have this done every 1-3 years. ?Abdominal aortic aneurysm (AAA) screening. You may need this if you are a current or former smoker. ?Osteoporosis. You may be screened starting at age 1 if you are at high risk. ?Talk with your health care provider about your test results, treatment options, and if necessary, the need for more tests. ?Vaccines  ?Your health care provider may recommend certain vaccines, such as: ?Influenza vaccine. This is recommended every year. ?Tetanus, diphtheria, and acellular pertussis (Tdap, Td) vaccine. You may need a Td booster every 10 years. ?Zoster vaccine. You may need this after age 67. ?Pneumococcal 13-valent conjugate (PCV13) vaccine. One dose is recommended after age 63. ?Pneumococcal polysaccharide (PPSV23) vaccine. One dose is recommended after age 87. ?Talk to your health care provider  about which screenings and vaccines you need  and how often you need them. ?This information is not intended to replace advice given to you by your health care provider. Make sure you discuss any questions you have with your health care provider. ?Document Released: 08/21/2015 Document Revised: 04/13/2016 Document Reviewed: 05/26/2015 ?Elsevier Interactive Patient Education ? 2017 [place]. ? ?Fall Prevention in the Home ?Falls can cause injuries. They can happen to people of all ages. There are many things you can do to make your home safe and to help prevent falls. ?What can I do on the outside of my home? ?Regularly fix the edges of walkways and driveways and fix any cracks. ?Remove anything that might make you trip as you walk through a door, such as a raised step or threshold. ?Trim any bushes or trees on the path to your home. ?Use bright outdoor lighting. ?Clear any walking paths of anything that might make someone trip, such as rocks or tools. ?Regularly check to see if handrails are loose or broken. Make sure that both sides of any steps have handrails. ?Any raised decks and porches should have guardrails on the edges. ?Have any leaves, snow, or ice cleared regularly. ?Use sand or salt on walking paths during winter. ?Clean up any spills in your garage right away. This includes oil or grease spills. ?What can I do in the bathroom? ?Use night lights. ?Install grab bars by the toilet and in the tub and shower. Do not use towel bars as grab bars. ?Use non-skid mats or decals in the tub or shower. ?If you need to sit down in the shower, use a plastic, non-slip stool. ?Keep the floor dry. Clean up any water that spills on the floor as soon as it happens. ?Remove soap buildup in the tub or shower regularly. ?Attach bath mats securely with double-sided non-slip rug tape. ?Do not have throw rugs and other things on the floor that can make you trip. ?What can I do in the bedroom? ?Use night lights. ?Make sure that you have a light by your bed that is easy  to reach. ?Do not use any sheets or blankets that are too big for your bed. They should not hang down onto the floor. ?Have a firm chair that has side arms. You can use this for support while you get dressed. ?Do not have throw rugs and other things on the floor that can make you trip. ?What can I do in the kitchen? ?Clean up any spills right away. ?Avoid walking on wet floors. ?Keep items that you use a lot in easy-to-reach places. ?If you need to reach something above you, use a strong step stool that has a grab bar. ?Keep electrical cords out of the way. ?Do not use floor polish or wax that makes floors slippery. If you must use wax, use non-skid floor wax. ?Do not have throw rugs and other things on the floor that can make you trip. ?What can I do with my stairs? ?Do not leave any items on the stairs. ?Make sure that there are handrails on both sides of the stairs and use them. Fix handrails that are broken or loose. Make sure that handrails are as long as the stairways. ?Check any carpeting to make sure that it is firmly attached to the stairs. Fix any carpet that is loose or worn. ?Avoid having throw rugs at the top or bottom of the stairs. If you do have throw rugs, attach them to the floor  with carpet tape. ?Make sure that you have a light switch at the top of the stairs and the bottom of the stairs. If you do not have them, ask someone to add them for you. ?What else can I do to help prevent falls? ?Wear shoes that: ?Do not have high heels. ?Have rubber bottoms. ?Are comfortable and fit you well. ?Are closed at the toe. Do not wear sandals. ?If you use a stepladder: ?Make sure that it is fully opened. Do not climb a closed stepladder. ?Make sure that both sides of the stepladder are locked into place. ?Ask someone to hold it for you, if possible. ?Clearly mark and make sure that you can see: ?Any grab bars or handrails. ?First and last steps. ?Where the edge of each step is. ?Use tools that help you move  around (mobility aids) if they are needed. These include: ?Canes. ?Walkers. ?Scooters. ?Crutches. ?Turn on the lights when you go into a dark area. Replace any light bulbs as soon as they burn out. ?Set

## 2021-12-17 ENCOUNTER — Other Ambulatory Visit: Payer: Self-pay | Admitting: Family Medicine

## 2022-01-19 NOTE — Progress Notes (Signed)
I,Roshena L Chambers,acting as a scribe for Lelon Huh, MD.,have documented all relevant documentation on the behalf of Lelon Huh, MD,as directed by  Lelon Huh, MD while in the presence of Lelon Huh, MD.    Complete physical exam   Patient: Andrew Villanueva   DOB: 1951/07/01   71 y.o. Male  MRN: [id number] Visit Date: 01/21/2022  Today's healthcare provider: Lelon Huh, MD   Chief Complaint  Patient presents with   Annual Exam   Hypertension   Subjective    Andrew Villanueva is a 71 y.o. male who presents today for a complete physical exam.  He reports consuming a general diet. Home exercise routine includes walking. He generally feels fairly well. He reports sleeping well. He does have additional problems to discuss today.  HPI  AWV was done 11/08/2021  Hypertension, follow-up  BP Readings from Last 3 Encounters:  01/21/22 (!) 146/76  09/01/21 (!) 151/87  04/27/21 136/85   Wt Readings from Last 3 Encounters:  01/21/22 230 lb (104.3 kg)  11/08/21 227 lb (103 kg)  09/01/21 227 lb (103 kg)     He was last seen for hypertension 4 months ago.  BP at that visit was 151/87. Management since that visit includes starting amLODipine (NORVASC) 5 MG .   He reports good compliance with treatment. He is not having side effects.  He is following a Regular diet. He is exercising. He does not smoke.  Outside blood pressures are 138/84. Symptoms: No chest pain No chest pressure  No palpitations No syncope  No dyspnea No orthopnea  No paroxysmal nocturnal dyspnea No lower extremity edema    The 10-year ASCVD risk score (Arnett DK, et al., 2019) is: 20.2%  ---------------------------------------------------------------------------------------------------   He also complains that since GI surgeries and cholecystectomy he has to have a few watery Bms every morning, then is fine the rest of the day.   He also states he hs had tingling in his toes and feet for a  few years getting more persistent.   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, cecum, proximal ascending colon, s/p hemicolectomy 2020   GERD (gastroesophageal reflux disease)    OCC-TUMS PRN   History of measles    Wears dentures    upper partial   Past Surgical History:  Procedure Laterality Date   CHOLECYSTECTOMY N/A 05/30/2018   Procedure: LAPAROSCOPIC CHOLECYSTECTOMY;  Surgeon: Vickie Epley, MD;  Location: [facility];  Service: General;  Laterality: N/A;   COLONOSCOPY     COLONOSCOPY WITH PROPOFOL N/A 01/23/2017   Procedure: COLONOSCOPY WITH PROPOFOL;  Surgeon: Lucilla Lame, MD;  Location: [place];  Service: Endoscopy;  Laterality: N/A;   HERNIA REPAIR  09/2019   LAPAROSCOPIC PARTIAL COLECTOMY  12/20/2018   laparascopy, surgical, colectomy, partial with removal of terminal ileum with ileocolostomy. [facility] Dr. Victorio Palm   POLYPECTOMY  01/23/2017   Procedure: POLYPECTOMY;  Surgeon: Lucilla Lame, MD;  Location: [place];  Service: Endoscopy;;   Social History   Socioeconomic History   Marital status: Married    Spouse name: Not on file   Number of children: 2   Years of education: Not on  file   Highest education level: Not on file  Occupational History   Occupation: Associate Professor  Tobacco Use   Smoking status: Never   Smokeless tobacco: Never  Vaping Use   Vaping Use: Never used  Substance and Sexual Activity   Alcohol use: Yes    Alcohol/week: 2.0 standard drinks of alcohol    Types: 2 Cans of beer per week    Comment: minimal alcohol consumption   Drug use: No   Sexual activity: Not on file  Other Topics Concern   Not on file  Social History  Narrative   Not on file   Social Determinants of Health   Financial Resource Strain: Low Risk  (11/08/2021)   Overall Financial Resource Strain (CARDIA)    Difficulty of Paying Living Expenses: Not hard at all  Food Insecurity: No Food Insecurity (11/08/2021)   Hunger Vital Sign    Worried About Running Out of Food in the Last Year: Never true    Ran Out of Food in the Last Year: Never true  Transportation Needs: No Transportation Needs (11/08/2021)   PRAPARE - Hydrologist (Medical): No    Lack of Transportation (Non-Medical): No  Physical Activity: Insufficiently Active (11/08/2021)   Exercise Vital Sign    Days of Exercise per Week: 2 days    Minutes of Exercise per Session: 20 min  Stress: No Stress Concern Present (11/08/2021)   [place]    Feeling of Stress : Not at all  Social Connections: Moderately Isolated (11/08/2021)   Social Connection and Isolation Panel [NHANES]    Frequency of Communication with Friends and Family: More than three times a week    Frequency of Social Gatherings with Friends and Family: More than three times a week    Attends Religious Services: Never    Marine scientist or Organizations: No    Attends Archivist Meetings: Never    Marital Status: Married  Human resources officer Violence: Not At Risk (11/08/2021)   Humiliation, Afraid, Rape, and Kick questionnaire    Fear of Current or Ex-Partner: No    Emotionally Abused: No    Physically Abused: No    Sexually Abused: No   Family Status  Relation Name Status   Mother  Deceased at age 26   Father  Deceased at age 5       natural causes   Brother  Alive   Daughter  Alive   Son  Alive   Brother  Deceased   Other 31 Sister Deceased   Neg Hx  (Not Specified)   Family History  Problem Relation Age of Onset   Diabetes Mother    Heart attack Mother    CAD Father    Diabetes Father    Lung  cancer Brother    Colon cancer Neg Hx    Prostate cancer Neg Hx    No Known Allergies  Patient Care Team: Birdie Sons, MD as PCP - General (Family Medicine) Bridget Hartshorn, Noel Journey, MD as Referring Physician (Dermatology) Larina Earthly, MD as Referring Physician (Hematology and Oncology)   Medications: Outpatient Medications Prior to Visit  Medication Sig   amLODipine (NORVASC) 5 MG tablet TAKE 1 TABLET BY MOUTH DAILY   No facility-administered medications prior to visit.    Review of Systems  Constitutional:  Negative for appetite change, chills, fatigue and fever.  HENT:  Negative for congestion, ear pain,  hearing loss, nosebleeds and trouble swallowing.   Eyes:  Negative for pain and visual disturbance.  Respiratory:  Negative for cough, chest tightness and shortness of breath.   Cardiovascular:  Negative for chest pain, palpitations and leg swelling.  Gastrointestinal:  Negative for abdominal pain, blood in stool, constipation, diarrhea, nausea and vomiting.  Endocrine: Negative for polydipsia, polyphagia and polyuria.  Genitourinary:  Negative for dysuria and flank pain.  Musculoskeletal:  Positive for joint swelling (in left ankle). Negative for arthralgias, back pain, myalgias and neck stiffness.  Skin:  Negative for color change, rash and wound.  Neurological:  Positive for dizziness and light-headedness. Negative for tremors, seizures, speech difficulty, weakness and headaches.       Tingling in feet  Psychiatric/Behavioral:  Negative for behavioral problems, confusion, decreased concentration, dysphoric mood, self-injury and sleep disturbance. The patient is not nervous/anxious.   All other systems reviewed and are negative.     Objective     BP (!) 146/76 (BP Location: Right Arm, Patient Position: Sitting, Cuff Size: Large)   Pulse (!) 58   Temp 97.8 F (36.6 C) (Oral)   Resp 14   Ht _0  (1.854 m)   Wt 230 lb (104.3 kg)   SpO2 98% Comment: room air   BMI 30.34 kg/m   Today's Vitals   01/21/22 0857 01/21/22 0901  BP: (!) 148/72 (!) 146/76  Pulse: (!) 58   Resp: 14   Temp: 97.8 F (36.6 C)   TempSrc: Oral   SpO2: 98%   Weight: 230 lb (104.3 kg)   Height: _1  (1.854 m)    Body mass index is 30.34 kg/m.   Physical Exam   General Appearance:    Mildly obese male. Alert, cooperative, in no acute distress, appears stated age  Head:    Normocephalic, without obvious abnormality, atraumatic  Eyes:    PERRL, conjunctiva/corneas clear, EOM's intact, fundi    benign, both eyes       Ears:    Normal TM's and external ear canals, both ears  Nose:   Nares normal, septum midline, mucosa normal, no drainage   or sinus tenderness  Throat:   Lips, mucosa, and tongue normal; teeth and gums normal  Neck:   Supple, symmetrical, trachea midline, no adenopathy;       thyroid:  No enlargement/tenderness/nodules; no carotid   bruit or JVD  Back:     Symmetric, no curvature, ROM normal, no CVA tenderness  Lungs:     Clear to auscultation bilaterally, respirations unlabored  Chest wall:    No tenderness or deformity  Heart:    Bradycardic. Normal rhythm. No murmurs, rubs, or gallops.  S1 and S2 normal  Abdomen:     Soft, non-tender, bowel sounds active all four quadrants,    no masses, no organomegaly  Genitalia:    deferred  Rectal:    deferred  Extremities:   All extremities are intact. No cyanosis or edema  Pulses:   2+ and symmetric all extremities  Skin:   Skin color, texture, turgor normal, no rashes or lesions  Lymph nodes:   Cervical, supraclavicular, and axillary nodes normal  Neurologic:   CNII-XII intact. Normal strength, sensation and reflexes      throughout     Last depression screening scores    01/21/2022    9:05 AM 11/08/2021    2:47 PM 01/20/2021    9:23 AM  PHQ 2/9 Scores  PHQ - 2 Score 0 0 0  PHQ- 9 Score 0  0   Last fall risk screening    01/21/2022    9:06 AM  [place] in the past year? 0  Number  falls in past yr: 0  Injury with Fall? 0  Risk for fall due to : No Fall Risks  Follow up Falls evaluation completed   Last Audit-C alcohol use screening    01/21/2022    9:05 AM  Alcohol Use Disorder Test (AUDIT)  1. How often do you have a drink containing alcohol? 1  2. How many drinks containing alcohol do you have on a typical day when you are drinking? 0  3. How often do you have six or more drinks on one occasion? 0  AUDIT-C Score 1   A score of 3 or more in women, and 4 or more in men indicates increased risk for alcohol abuse, EXCEPT if all of the points are from question 1   No results found for any visits on 01/21/22.  Assessment & Plan    Routine Health Maintenance and Physical Exam  Exercise Activities and Dietary recommendations  Goals      DIET - EAT MORE FRUITS AND VEGETABLES        Immunization History  Administered Date(s) Administered   Fluad Quad(high Dose 65+) 05/31/2019, 06/02/2021   Influenza,inj,Quad PF,6+ Mos 07/09/2015, 06/15/2018, 06/11/2020   Moderna Sars-Covid-2 Vaccination 09/03/2019, 10/01/2019   Pneumococcal Conjugate-13 11/21/2016   Pneumococcal Polysaccharide-23 12/04/2017   Tdap 10/25/2006, 04/01/2015   Zoster, Live 11/07/2015    Health Maintenance  Topic Date Due   Zoster Vaccines- Shingrix (1 of 2) Never done   COVID-19 Vaccine (3 - Moderna risk series) 10/29/2019   INFLUENZA VACCINE  03/08/2022   COLONOSCOPY (Pts 45-53yr Insurance coverage will need to be confirmed)  12/17/2023   TETANUS/TDAP  03/31/2025   Pneumonia Vaccine 71 Years old  Completed   Hepatitis C Screening  Completed   HPV VACCINES  Aged Out    Discussed health benefits of physical activity, and encouraged him to engage in regular exercise appropriate for his age and condition.  1. Annual physical exam Reviewed labs including CBC and [facility]   2. Prostate cancer screening  - PSA Total (Reflex To Free) ([facility]  only)  3. Prescription for Shingrix. Vaccine not administered in office.   - Zoster Vaccine Adjuvanted ([facility] injection; Inject 0.5 mLs into the muscle once for 1 dose.  Dispense: 0.5 mL; Refill: 0  4. Primary hypertension Is tolerating initiation of amlodipine, but BP is not at goal.  Add benazepril (LOTENSIN) 5 MG tablet; Take 1 tablet (5 mg total) by mouth daily.  Dispense: 30 tablet; Refill: 1 Follow up BP check in a month.   - Lipid panel - EKG 12-Lead - TSH  5. Diarrhea, unspecified type Started after colectomy and cholecystectomy several years ago. Will try cholestyramine light (PREVALITE) 4 g packet; Take 1 packet (4 g total) by mouth daily.  Dispense: 30 each; Refill: 3   6. Diffuse large B-cell lymphoma of [place] nodes [place] [facility] completed chemotherapy on active surveillance Q 6 months.   7. Numbness and tingling of lower extremity May be secondary chemo, will check  Vitamin B12  8. Hyperprolactinemia (HCC)  - Prolactin     The entirety of the information documented in the History of Present Illness, Review of Systems and Physical Exam were personally obtained by me.  Portions of this information were initially documented by the CMA and reviewed by me for thoroughness and accuracy.     Lelon Huh, MD  [facility] [phone] (phone) [phone] (fax)  [place]

## 2022-01-21 ENCOUNTER — Encounter: Payer: Self-pay | Admitting: Family Medicine

## 2022-01-21 ENCOUNTER — Ambulatory Visit (INDEPENDENT_AMBULATORY_CARE_PROVIDER_SITE_OTHER): Payer: Medicare HMO | Admitting: Family Medicine

## 2022-01-21 ENCOUNTER — Telehealth: Payer: Self-pay

## 2022-01-21 VITALS — BP 146/76 | HR 58 | Temp 97.8°F | Resp 14 | Ht 73.0 in | Wt 230.0 lb

## 2022-01-21 DIAGNOSIS — C8333 Diffuse large B-cell lymphoma, intra-abdominal lymph nodes: Secondary | ICD-10-CM | POA: Diagnosis not present

## 2022-01-21 DIAGNOSIS — Z0000 Encounter for general adult medical examination without abnormal findings: Secondary | ICD-10-CM

## 2022-01-21 DIAGNOSIS — R200 Anesthesia of skin: Secondary | ICD-10-CM

## 2022-01-21 DIAGNOSIS — R202 Paresthesia of skin: Secondary | ICD-10-CM

## 2022-01-21 DIAGNOSIS — Z125 Encounter for screening for malignant neoplasm of prostate: Secondary | ICD-10-CM | POA: Diagnosis not present

## 2022-01-21 DIAGNOSIS — I10 Essential (primary) hypertension: Secondary | ICD-10-CM | POA: Insufficient documentation

## 2022-01-21 DIAGNOSIS — E221 Hyperprolactinemia: Secondary | ICD-10-CM

## 2022-01-21 DIAGNOSIS — R197 Diarrhea, unspecified: Secondary | ICD-10-CM | POA: Diagnosis not present

## 2022-01-21 DIAGNOSIS — Z289 Immunization not carried out for unspecified reason: Secondary | ICD-10-CM

## 2022-01-21 MED ORDER — PREVALITE 4 G PO PACK
4.0000 g | PACK | Freq: Every day | ORAL | 3 refills | Status: DC
Start: 2022-01-21 — End: 2022-04-06

## 2022-01-21 MED ORDER — SHINGRIX 50 MCG/0.5ML IM SUSR: 0.5000 mL | Freq: Once | INTRAMUSCULAR | 0 refills | Status: AC

## 2022-01-21 MED ORDER — BENAZEPRIL HCL 5 MG PO TABS: 5.0000 mg | ORAL_TABLET | Freq: Every day | ORAL | 1 refills | Status: DC

## 2022-01-21 NOTE — Telephone Encounter (Signed)
Copied from [place] [id number]. Topic: Appointment Scheduling - Scheduling Inquiry for Clinic >> Jan 21, 2022  1:48 PM Leitha Schuller wrote: Reason for CRM: Patients spouse states patient forgot to schedule AWV for 2024 after today's visit  Please contact patient and  assist further

## 2022-01-22 LAB — PROLACTIN: Prolactin: 14 ng/mL (ref 4.0–15.2)

## 2022-01-22 LAB — LIPID PANEL
Chol/HDL Ratio: 2.2 ratio (ref 0.0–5.0)
Cholesterol, Total: 129 mg/dL (ref 100–199)
HDL: 58 mg/dL (ref >39)
LDL Chol Calc (NIH): 58 mg/dL (ref 0–99)
Triglycerides: 61 mg/dL (ref 0–149)
VLDL Cholesterol Cal: 13 mg/dL (ref 5–40)

## 2022-01-22 LAB — TSH: TSH: 0.919 u[IU]/mL (ref 0.450–4.500)

## 2022-01-22 LAB — PSA TOTAL (REFLEX TO FREE): Prostate Specific Ag, Serum: 0.8 ng/mL (ref 0.0–4.0)

## 2022-01-22 LAB — VITAMIN B12: Vitamin B-12: 205 pg/mL — ABNORMAL LOW (ref 232–1245)

## 2022-01-24 ENCOUNTER — Encounter: Payer: Self-pay | Admitting: Family Medicine

## 2022-01-24 DIAGNOSIS — E538 Deficiency of other specified B group vitamins: Secondary | ICD-10-CM | POA: Insufficient documentation

## 2022-02-12 ENCOUNTER — Other Ambulatory Visit: Payer: Self-pay | Admitting: Family Medicine

## 2022-02-12 DIAGNOSIS — I10 Essential (primary) hypertension: Secondary | ICD-10-CM

## 2022-02-28 ENCOUNTER — Ambulatory Visit: Payer: Medicare HMO | Admitting: Family Medicine

## 2022-03-02 ENCOUNTER — Ambulatory Visit (INDEPENDENT_AMBULATORY_CARE_PROVIDER_SITE_OTHER): Payer: Medicare HMO | Admitting: Family Medicine

## 2022-03-02 ENCOUNTER — Encounter: Payer: Self-pay | Admitting: Family Medicine

## 2022-03-02 VITALS — BP 145/92 | HR 67 | Ht 73.0 in | Wt 230.0 lb

## 2022-03-02 DIAGNOSIS — I10 Essential (primary) hypertension: Secondary | ICD-10-CM

## 2022-03-02 MED ORDER — BENAZEPRIL HCL 10 MG PO TABS: 10.0000 mg | ORAL_TABLET | Freq: Every day | ORAL | 0 refills | Status: DC

## 2022-03-02 NOTE — Assessment & Plan Note (Addendum)
Elevated today and again on recheck. Will increase benazepril to '10mg'$ , continue amlodipine. Keep BP log at home, f/u in 1 month. Obtaining labs. Per chart review, BP wnl few years ago when 10lbs lighter, weight likely contributing also.

## 2022-03-02 NOTE — Patient Instructions (Signed)
It was great to see you!  Our plans for today:  - Take benazepril '10mg'$  daily in addition to your amlodipine. - Come back in 1 month for recheck   We are checking some labs today, we will release these results to your MyChart.  Take care and seek immediate care sooner if you develop any concerns.   Dr. Ky Barban

## 2022-03-02 NOTE — Progress Notes (Signed)
   SUBJECTIVE:   CHIEF COMPLAINT / HPI:   Hypertension: - Medications: benazepril (added at last visit), amlodipine - Compliance: good - Checking BP at home: not over the past month. Occasionally 120-130 SBP  - Denies any SOB, CP, vision changes, LE edema, medication SEs, or symptoms of hypotension   OBJECTIVE:   BP (!) 145/92 (BP Location: Left Arm)   Pulse 67   Ht '6\' 1"'$  (1.854 m)   Wt 230 lb (104.3 kg)   SpO2 98%   BMI 30.34 kg/m   Gen: well appearing, in NAD Card: RRR Lungs: CTAB Ext: WWP, no edema   ASSESSMENT/PLAN:   Primary hypertension Elevated today and again on recheck. Will increase benazepril to '10mg'$ , continue amlodipine. Keep BP log at home, f/u in 1 month. Obtaining labs. Per chart review, BP wnl few years ago when 10lbs lighter, weight likely contributing also.      Myles Gip, DO

## 2022-03-03 LAB — BASIC METABOLIC PANEL
BUN/Creatinine Ratio: 9 — ABNORMAL LOW (ref 10–24)
BUN: 8 mg/dL (ref 8–27)
CO2: 23 mmol/L (ref 20–29)
Calcium: 9.2 mg/dL (ref 8.6–10.2)
Chloride: 104 mmol/L (ref 96–106)
Creatinine, Ser: 0.9 mg/dL (ref 0.76–1.27)
Glucose: 102 mg/dL — ABNORMAL HIGH (ref 70–99)
Potassium: 4.3 mmol/L (ref 3.5–5.2)
Sodium: 140 mmol/L (ref 134–144)
eGFR: 92 mL/min/{1.73_m2} (ref >59)

## 2022-03-09 ENCOUNTER — Other Ambulatory Visit: Payer: Self-pay | Admitting: Family Medicine

## 2022-03-09 DIAGNOSIS — I10 Essential (primary) hypertension: Secondary | ICD-10-CM

## 2022-04-05 NOTE — Progress Notes (Signed)
     I,Sha'taria Tyson,acting as a Education administrator for Lelon Huh, MD.,have documented all relevant documentation on the behalf of Lelon Huh, MD,as directed by  Lelon Huh, MD while in the presence of Lelon Huh, MD.   Established patient visit   Patient: Andrew Villanueva   DOB: 01-08-1951   71 y.o. Male  MRN: [id number] Visit Date: 04/06/2022  Today's healthcare provider: Lelon Huh, MD   No chief complaint on file.  Subjective    HPI  Follow up for hypertension  The patient was last seen for this 4 weeks ago. Changes made at last visit include increase benazepril to '10mg'$ , continue amlodipine.  He reports excellent compliance with treatment. He feels that condition is Improved. He is not having side effects.       BP Readings from Last 3 Encounters:  03/02/22 (!) 145/92  01/21/22 (!) 146/76  09/01/21 (!) 151/87   -----------------------------------------------------------------------------------------   Medications: Outpatient Medications Prior to Visit  Medication Sig   amLODipine (NORVASC) 5 MG tablet TAKE 1 TABLET BY MOUTH DAILY   benazepril (LOTENSIN) 10 MG tablet Take 1 tablet (10 mg total) by mouth daily.   cholestyramine light (PREVALITE) 4 g packet Take 1 packet (4 g total) by mouth daily. (Patient not taking: Reported on 03/02/2022)   No facility-administered medications prior to visit.    Review of Systems  Constitutional:  Negative for appetite change, chills and fever.  Respiratory:  Negative for chest tightness, shortness of breath and wheezing.   Cardiovascular:  Negative for chest pain and palpitations.  Gastrointestinal:  Negative for abdominal pain, nausea and vomiting.       Objective    BP 137/80 (BP Location: Right Arm, Patient Position: Sitting, Cuff Size: Large)   Pulse (!) 58   Ht '6\' 1"'$  (1.854 m)   Wt 229 lb 4.8 oz (104 kg)   BMI 30.25 kg/m  BP Readings from Last 3 Encounters:  03/02/22 (!) 145/92  01/21/22 (!) 146/76   09/01/21 (!) 151/87   Wt Readings from Last 3 Encounters:  03/02/22 230 lb (104.3 kg)  01/21/22 230 lb (104.3 kg)  11/08/21 227 lb (103 kg)    Physical Exam  General appearance: Mildly obese male, cooperative and in no acute distress Head: Normocephalic, without obvious abnormality, atraumatic Respiratory: Respirations even and unlabored, normal respiratory rate Extremities: All extremities are intact.  Skin: Skin color, texture, turgor normal. No rashes seen  Psych: Appropriate mood and affect. Neurologic: Mental status: Alert, oriented to person, place, and time, thought content appropriate.    Assessment & Plan     1. Primary hypertension Well controlled with increase dose of benazepril to '10mg'$ . Will send refill to local pharmacy end of October.   2. Diarrhea, unspecified type Likely due to prior cholecystectomy and colon surgeries. Never filled Prevalite packet prescription from June due to cost. Will try cholestyramine Lucrezia Starch) 4 GM/DOSE powder; Take 1 packet (4 g total) by mouth 2 (two) times daily as needed.  Dispense: 378 g; Refill: 5      The entirety of the information documented in the History of Present Illness, Review of Systems and Physical Exam were personally obtained by me. Portions of this information were initially documented by the CMA and reviewed by me for thoroughness and accuracy.     Lelon Huh, MD  [facility] [phone] (phone) [phone] (fax)  [place]

## 2022-04-06 ENCOUNTER — Ambulatory Visit (INDEPENDENT_AMBULATORY_CARE_PROVIDER_SITE_OTHER): Payer: Medicare HMO | Admitting: Family Medicine

## 2022-04-06 ENCOUNTER — Encounter: Payer: Self-pay | Admitting: Family Medicine

## 2022-04-06 VITALS — BP 137/80 | HR 58 | Ht 73.0 in | Wt 229.3 lb

## 2022-04-06 DIAGNOSIS — I10 Essential (primary) hypertension: Secondary | ICD-10-CM

## 2022-04-06 DIAGNOSIS — R197 Diarrhea, unspecified: Secondary | ICD-10-CM | POA: Diagnosis not present

## 2022-04-06 MED ORDER — CHOLESTYRAMINE 4 GM/DOSE PO POWD: 4.0000 g | Freq: Two times a day (BID) | ORAL | 5 refills | Status: DC | PRN

## 2022-04-06 NOTE — Patient Instructions (Signed)
.   Please review the attached list of medications and notify my office if there are any errors.   . Please bring all of your medications to every appointment so we can make sure that our medication list is the same as yours.   

## 2022-05-19 ENCOUNTER — Other Ambulatory Visit: Payer: Self-pay | Admitting: Family Medicine

## 2022-05-19 NOTE — Telephone Encounter (Signed)
Requested Prescriptions  Pending Prescriptions Disp Refills  . benazepril (LOTENSIN) 10 MG tablet [Pharmacy Med Name: BENAZEPRIL HCL 10 MG TABLET] 90 tablet 0    Sig: TAKE 1 TABLET BY MOUTH EVERY DAY     Cardiovascular:  ACE Inhibitors Passed - 05/19/2022  7:47 AM      Passed - Cr in normal range and within 180 days    Creatinine, Ser  Date Value Ref Range Status  03/02/2022 0.90 0.76 - 1.27 mg/dL Final         Passed - K in normal range and within 180 days    Potassium  Date Value Ref Range Status  03/02/2022 4.3 3.5 - 5.2 mmol/L Final         Passed - Patient is not pregnant      Passed - Last BP in normal range    BP Readings from Last 1 Encounters:  04/06/22 137/80         Passed - Valid encounter within last 6 months    Recent Outpatient Visits          1 month ago Primary hypertension   [facility] Birdie Sons, MD   2 months ago Primary hypertension   [facility] Myles Gip, DO   3 months ago Annual physical exam   [facility] Birdie Sons, MD   8 months ago Primary hypertension   [facility] Birdie Sons, MD   1 year ago Transient elevated blood pressure   [facility] Birdie Sons, MD

## 2022-06-24 ENCOUNTER — Ambulatory Visit (INDEPENDENT_AMBULATORY_CARE_PROVIDER_SITE_OTHER): Payer: Medicare HMO | Admitting: Physician Assistant

## 2022-06-24 DIAGNOSIS — Z23 Encounter for immunization: Secondary | ICD-10-CM | POA: Diagnosis not present

## 2022-11-12 ENCOUNTER — Other Ambulatory Visit: Payer: Self-pay | Admitting: Family Medicine

## 2022-11-14 ENCOUNTER — Ambulatory Visit (INDEPENDENT_AMBULATORY_CARE_PROVIDER_SITE_OTHER): Payer: Medicare HMO

## 2022-11-14 VITALS — Ht 73.0 in | Wt 229.0 lb

## 2022-11-14 DIAGNOSIS — Z0000 Encounter for general adult medical examination without abnormal findings: Secondary | ICD-10-CM

## 2022-11-14 NOTE — Patient Instructions (Signed)
Mr. Andrew Villanueva , Thank you for taking time to come for your Medicare Wellness Visit. I appreciate your ongoing commitment to your health goals. Please review the following plan we discussed and let me know if I can assist you in the future.   These are the goals we discussed:  Goals      DIET - EAT MORE FRUITS AND VEGETABLES        This is a list of the screening recommended for you and due dates:  Health Maintenance  Topic Date Due   Zoster (Shingles) Vaccine (1 of 2) Never done   COVID-19 Vaccine (3 - Moderna risk series) 10/29/2019   Flu Shot  03/09/2023   Colon Cancer Screening  12/17/2023   DTaP/Tdap/Td vaccine (3 - Td or Tdap) 03/31/2025   Pneumonia Vaccine  Completed   Hepatitis C Screening: USPSTF Recommendation to screen - Ages 104-79 yo.  Completed   HPV Vaccine  Aged Out    Advanced directives: no  Conditions/risks identified: none  Next appointment: Follow up in one year for your annual wellness visit. 11/20/2023 @10 :15am telephone  Preventive Care 65 Years and Older, Male  Preventive care refers to lifestyle choices and visits with your health care provider that can promote health and wellness. What does preventive care include? A yearly physical exam. This is also called an annual well check. Dental exams once or twice a year. Routine eye exams. Ask your health care provider how often you should have your eyes checked. Personal lifestyle choices, including: Daily care of your teeth and gums. Regular physical activity. Eating a healthy diet. Avoiding tobacco and drug use. Limiting alcohol use. Practicing safe sex. Taking low doses of aspirin every day. Taking vitamin and mineral supplements as recommended by your health care provider. What happens during an annual well check? The services and screenings done by your health care provider during your annual well check will depend on your age, overall health, lifestyle risk factors, and family history of  disease. Counseling  Your health care provider may ask you questions about your: Alcohol use. Tobacco use. Drug use. Emotional well-being. Home and relationship well-being. Sexual activity. Eating habits. History of falls. Memory and ability to understand (cognition). Work and work Astronomer. Screening  You may have the following tests or measurements: Height, weight, and BMI. Blood pressure. Lipid and cholesterol levels. These may be checked every 5 years, or more frequently if you are over 77 years old. Skin check. Lung cancer screening. You may have this screening every year starting at age 9 if you have a 30-pack-year history of smoking and currently smoke or have quit within the past 15 years. Fecal occult blood test (FOBT) of the stool. You may have this test every year starting at age 71. Flexible sigmoidoscopy or colonoscopy. You may have a sigmoidoscopy every 5 years or a colonoscopy every 10 years starting at age 40. Prostate cancer screening. Recommendations will vary depending on your family history and other risks. Hepatitis C blood test. Hepatitis B blood test. Sexually transmitted disease (STD) testing. Diabetes screening. This is done by checking your blood sugar (glucose) after you have not eaten for a while (fasting). You may have this done every 1-3 years. Abdominal aortic aneurysm (AAA) screening. You may need this if you are a current or former smoker. Osteoporosis. You may be screened starting at age 66 if you are at high risk. Talk with your health care provider about your test results, treatment options, and if necessary, the  need for more tests. Vaccines  Your health care provider may recommend certain vaccines, such as: Influenza vaccine. This is recommended every year. Tetanus, diphtheria, and acellular pertussis (Tdap, Td) vaccine. You may need a Td booster every 10 years. Zoster vaccine. You may need this after age 68. Pneumococcal 13-valent  conjugate (PCV13) vaccine. One dose is recommended after age 34. Pneumococcal polysaccharide (PPSV23) vaccine. One dose is recommended after age 23. Talk to your health care provider about which screenings and vaccines you need and how often you need them. This information is not intended to replace advice given to you by your health care provider. Make sure you discuss any questions you have with your health care provider. Document Released: 08/21/2015 Document Revised: 04/13/2016 Document Reviewed: 05/26/2015 Elsevier Interactive Patient Education  2017 [place] Prevention in the Home Falls can cause injuries. They can happen to people of all ages. There are many things you can do to make your home safe and to help prevent falls. What can I do on the outside of my home? Regularly fix the edges of walkways and driveways and fix any cracks. Remove anything that might make you trip as you walk through a door, such as a raised step or threshold. Trim any bushes or trees on the path to your home. Use bright outdoor lighting. Clear any walking paths of anything that might make someone trip, such as rocks or tools. Regularly check to see if handrails are loose or broken. Make sure that both sides of any steps have handrails. Any raised decks and porches should have guardrails on the edges. Have any leaves, snow, or ice cleared regularly. Use sand or salt on walking paths during winter. Clean up any spills in your garage right away. This includes oil or grease spills. What can I do in the bathroom? Use night lights. Install grab bars by the toilet and in the tub and shower. Do not use towel bars as grab bars. Use non-skid mats or decals in the tub or shower. If you need to sit down in the shower, use a plastic, non-slip stool. Keep the floor dry. Clean up any water that spills on the floor as soon as it happens. Remove soap buildup in the tub or shower regularly. Attach bath mats  securely with double-sided non-slip rug tape. Do not have throw rugs and other things on the floor that can make you trip. What can I do in the bedroom? Use night lights. Make sure that you have a light by your bed that is easy to reach. Do not use any sheets or blankets that are too big for your bed. They should not hang down onto the floor. Have a firm chair that has side arms. You can use this for support while you get dressed. Do not have throw rugs and other things on the floor that can make you trip. What can I do in the kitchen? Clean up any spills right away. Avoid walking on wet floors. Keep items that you use a lot in easy-to-reach places. If you need to reach something above you, use a strong step stool that has a grab bar. Keep electrical cords out of the way. Do not use floor polish or wax that makes floors slippery. If you must use wax, use non-skid floor wax. Do not have throw rugs and other things on the floor that can make you trip. What can I do with my stairs? Do not leave any items on the  stairs. Make sure that there are handrails on both sides of the stairs and use them. Fix handrails that are broken or loose. Make sure that handrails are as long as the stairways. Check any carpeting to make sure that it is firmly attached to the stairs. Fix any carpet that is loose or worn. Avoid having throw rugs at the top or bottom of the stairs. If you do have throw rugs, attach them to the floor with carpet tape. Make sure that you have a light switch at the top of the stairs and the bottom of the stairs. If you do not have them, ask someone to add them for you. What else can I do to help prevent falls? Wear shoes that: Do not have high heels. Have rubber bottoms. Are comfortable and fit you well. Are closed at the toe. Do not wear sandals. If you use a stepladder: Make sure that it is fully opened. Do not climb a closed stepladder. Make sure that both sides of the stepladder  are locked into place. Ask someone to hold it for you, if possible. Clearly mark and make sure that you can see: Any grab bars or handrails. First and last steps. Where the edge of each step is. Use tools that help you move around (mobility aids) if they are needed. These include: Canes. Walkers. Scooters. Crutches. Turn on the lights when you go into a dark area. Replace any light bulbs as soon as they burn out. Set up your furniture so you have a clear path. Avoid moving your furniture around. If any of your floors are uneven, fix them. If there are any pets around you, be aware of where they are. Review your medicines with your doctor. Some medicines can make you feel dizzy. This can increase your chance of falling. Ask your doctor what other things that you can do to help prevent falls. This information is not intended to replace advice given to you by your health care provider. Make sure you discuss any questions you have with your health care provider. Document Released: 05/21/2009 Document Revised: 12/31/2015 Document Reviewed: 08/29/2014 Elsevier Interactive Patient Education  2017 [employer].

## 2022-11-14 NOTE — Progress Notes (Signed)
I connected with  Andrew Villanueva on 11/14/22 by a audio enabled telemedicine application and verified that I am speaking with the correct person using two identifiers.  Patient Location: Home  Provider Location: Office/Clinic  I discussed the limitations of evaluation and management by telemedicine. The patient expressed understanding and agreed to proceed.  Subjective:   Andrew Villanueva is a 72 y.o. male who presents for Medicare Annual/Subsequent preventive examination.  Review of Systems    Cardiac Risk Factors include: advanced age (>45men, >32 women);hypertension;male gender;obesity (BMI >30kg/m2)    Objective:    Today's Vitals   11/14/22 1054  Weight: 229 lb (103.9 kg)  Height: 6\' 1"  (1.854 m)   Body mass index is 30.21 kg/m.     11/14/2022   10:59 AM 11/08/2021    2:49 PM 05/30/2018    6:15 AM 05/23/2018    3:39 PM 05/07/2018    3:05 PM 01/23/2017    8:04 AM  Advanced Directives  Does Patient Have a Medical Advance Directive? No No No No No No  Would patient like information on creating a medical advance directive?  No - Patient declined No - Patient declined   Yes (MAU/Ambulatory/Procedural Areas - Information given)    Current Medications (verified) Outpatient Encounter Medications as of 11/14/2022  Medication Sig   amLODipine (NORVASC) 5 MG tablet TAKE 1 TABLET BY MOUTH DAILY   benazepril (LOTENSIN) 10 MG tablet TAKE 1 TABLET BY MOUTH EVERY DAY   cholestyramine (QUESTRAN) 4 GM/DOSE powder Take 1 packet (4 g total) by mouth 2 (two) times daily as needed. (Patient not taking: Reported on 11/14/2022)   No facility-administered encounter medications on file as of 11/14/2022.    Allergies (verified) Patient has no known allergies.   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, cecum, proximal ascending colon, s/p hemicolectomy 2020   GERD (gastroesophageal reflux disease)    OCC-TUMS PRN   History of measles    Hyperprolactinemia 04/23/2020   Referred [facility] endocrinology 04/2020   Wears dentures    upper partial   Past Surgical History:  Procedure Laterality Date   CHOLECYSTECTOMY N/A 05/30/2018   Procedure: LAPAROSCOPIC CHOLECYSTECTOMY;  Surgeon: Ancil Linsey, MD;  Location: [facility];  Service: General;  Laterality: N/A;   COLONOSCOPY     COLONOSCOPY WITH PROPOFOL N/A 01/23/2017   Procedure: COLONOSCOPY WITH PROPOFOL;  Surgeon: Midge Minium, MD;  Location: [facility];  Service: Endoscopy;  Laterality: N/A;   HERNIA REPAIR  09/2019   LAPAROSCOPIC PARTIAL COLECTOMY  12/20/2018   laparascopy, surgical, colectomy, partial with removal of terminal ileum with ileocolostomy. [facility] Dr. Milbert Coulter   POLYPECTOMY  01/23/2017   Procedure: POLYPECTOMY;  Surgeon: Midge Minium, MD;  Location: [facility];  Service: Endoscopy;;   Family History  Problem Relation Age of Onset   Diabetes Mother    Heart attack Mother    CAD Father    Diabetes Father    Lung cancer Brother    Colon cancer Neg Hx    Prostate cancer Neg Hx    Social History   Socioeconomic History   Marital status: Married    Spouse name: Not on file  Number of children: 2   Years of education: Not on file   Highest education level: Not on file  Occupational History   Occupation: Technical sales engineer  Tobacco Use   Smoking status: Never   Smokeless tobacco: Never  Vaping Use   Vaping Use: Never used  Substance and Sexual Activity   Alcohol use: Yes    Alcohol/week: 2.0 standard drinks of alcohol    Types: 2 Cans of beer per week    Comment: minimal alcohol consumption   Drug use: No   Sexual activity: Not on file  Other Topics Concern   Not on file  Social History Narrative    Not on file   Social Determinants of Health   Financial Resource Strain: Low Risk  (11/14/2022)   Overall Financial Resource Strain (CARDIA)    Difficulty of Paying Living Expenses: Not hard at all  Food Insecurity: No Food Insecurity (11/14/2022)   Hunger Vital Sign    Worried About Running Out of Food in the Last Year: Never true    Ran Out of Food in the Last Year: Never true  Transportation Needs: No Transportation Needs (11/14/2022)   PRAPARE - Administrator, Civil Service (Medical): No    Lack of Transportation (Non-Medical): No  Physical Activity: Insufficiently Active (11/14/2022)   Exercise Vital Sign    Days of Exercise per Week: 2 days    Minutes of Exercise per Session: 20 min  Stress: No Stress Concern Present (11/14/2022)   [employer] of Occupational Health - Occupational Stress Questionnaire    Feeling of Stress : Not at all  Social Connections: Moderately Isolated (11/14/2022)   Social Connection and Isolation Panel [NHANES]    Frequency of Communication with Friends and Family: More than three times a week    Frequency of Social Gatherings with Friends and Family: More than three times a week    Attends Religious Services: Never    Database administrator or Organizations: No    Attends Engineer, structural: Never    Marital Status: Married    Tobacco Counseling Counseling given: Not Answered   Clinical Intake:  Pre-visit preparation completed: Yes  Pain : No/denies pain     BMI - recorded: 30.21 Nutritional Status: BMI > 30  Obese Nutritional Risks: None Diabetes: No     Diabetic?no  Interpreter Needed?: No  Comments: loives with wife Information entered by :: B.Shloime Keilman,LPN   Activities of Daily Living    11/14/2022   11:00 AM 04/06/2022    9:28 AM  In your present state of health, do you have any difficulty performing the following activities:  Hearing? 0 0  Vision? 0 0  Difficulty concentrating or making decisions?  0 0  Walking or climbing stairs? 0 0  Dressing or bathing? 0 0  Doing errands, shopping? 0 0  Preparing Food and eating ? N   Using the Toilet? N   In the past six months, have you accidently leaked urine? N   Do you have problems with loss of bowel control? N   Managing your Medications? N   Managing your Finances? N   Housekeeping or managing your Housekeeping? N     Patient Care Team: [name], MD as PCP - General (Family Medicine) Domenic Schwab, Lucianne Lei, MD as Referring Physician (Dermatology) Rolanda Lundborg, MD as Referring Physician (Hematology and Oncology)  Indicate any recent Medical Services you may have received from other than [facility] providers in  the past year (date may be approximate).     Assessment:   This is a routine wellness examination for Andrew Villanueva.  Hearing/Vision screen Hearing Screening - Comments:: Adequate hearing Vision Screening - Comments:: Adequate vision w/glasses [facility] [place]  Dietary issues and exercise activities discussed: Current Exercise Habits: Home exercise routine, Type of exercise: walking, Time (Minutes): 20, Frequency (Times/Week): 2, Weekly Exercise (Minutes/Week): 40, Intensity: Mild, Exercise limited by: None identified   Goals Addressed             This Visit's Progress    DIET - EAT MORE FRUITS AND VEGETABLES   On track      Depression Screen    11/14/2022   10:57 AM 04/06/2022    9:28 AM 03/02/2022    9:22 AM 01/21/2022    9:05 AM 11/08/2021    2:47 PM 01/20/2021    9:23 AM 01/20/2020    8:32 AM  PHQ 2/9 Scores  PHQ - 2 Score 0 0 0 0 0 0 0  PHQ- 9 Score  0 0 0  0 1    Fall Risk    11/14/2022   10:56 AM 04/06/2022    9:28 AM 03/02/2022    9:23 AM 01/21/2022    9:06 AM 11/08/2021    2:50 PM  Fall Risk   Falls in the past year? 0 0 0 0 0  Number falls in past yr: 0 0 0 0 0  Injury with Fall? 0 0 0 0 0  Risk for fall due to : No Fall Risks No Fall Risks No Fall Risks No Fall Risks No Fall Risks  Follow up  Education provided;Falls prevention discussed Falls evaluation completed Falls evaluation completed Falls evaluation completed Falls evaluation completed    FALL RISK PREVENTION PERTAINING TO THE HOME:  Any stairs in or around the home? Yes  If so, are there any without handrails? Yes  Home free of loose throw rugs in walkways, pet beds, electrical cords, etc? Yes  Adequate lighting in your home to reduce risk of falls? Yes   ASSISTIVE DEVICES UTILIZED TO PREVENT FALLS:  Life alert? No  Use of a cane, walker or w/c? No  Grab bars in the bathroom? Yes  Shower chair or bench in shower? No  Elevated toilet seat or a handicapped toilet? Yes    Cognitive Function:        11/14/2022   11:08 AM 01/20/2021    9:20 AM 01/20/2020    8:27 AM  6CIT Screen  What Year? 0 points 0 points 0 points  What month? 0 points 0 points 0 points  What time? 0 points 0 points 0 points  Count back from 20 0 points 0 points 0 points  Months in reverse 0 points 2 points 0 points  Repeat phrase 0 points 4 points 2 points  Total Score 0 points 6 points 2 points    Immunizations Immunization History  Administered Date(s) Administered   Fluad Quad(high Dose 65+) 05/31/2019, 06/02/2021, 06/24/2022   Influenza,inj,Quad PF,6+ Mos 07/09/2015, 06/15/2018, 06/11/2020   Moderna Sars-Covid-2 Vaccination 09/03/2019, 10/01/2019   Pneumococcal Conjugate-13 11/21/2016   Pneumococcal Polysaccharide-23 12/04/2017   Tdap 10/25/2006, 04/01/2015   Zoster, Live 11/07/2015    TDAP status: Up to date  Flu Vaccine status: Up to date  Pneumococcal vaccine status: Up to date  Covid-19 vaccine status: Completed vaccines  Qualifies for Shingles Vaccine? Yes   Zostavax completed No   Shingrix Completed?: No.    Education has  been provided regarding the importance of this vaccine. Patient has been advised to call insurance company to determine out of pocket expense if they have not yet received this vaccine. Advised may  also receive vaccine at local pharmacy or Health Dept. Verbalized acceptance and understanding.  Screening Tests Health Maintenance  Topic Date Due   Zoster Vaccines- Shingrix (1 of 2) Never done   COVID-19 Vaccine (3 - Moderna risk series) 10/29/2019   INFLUENZA VACCINE  03/09/2023   COLONOSCOPY (Pts 45-43yrs Insurance coverage will need to be confirmed)  12/17/2023   DTaP/Tdap/Td (3 - Td or Tdap) 03/31/2025   Pneumonia Vaccine 58+ Years old  Completed   Hepatitis C Screening  Completed   HPV VACCINES  Aged Out    Health Maintenance  Health Maintenance Due  Topic Date Due   Zoster Vaccines- Shingrix (1 of 2) Never done   COVID-19 Vaccine (3 - Moderna risk series) 10/29/2019    Colorectal cancer screening: Type of screening: Colonoscopy. Completed yes. Repeat every 5 years  Lung Cancer Screening: (Low Dose CT Chest recommended if Age 76-80 years, 30 pack-year currently smoking OR have quit w/in 15years.) does not qualify.   Lung Cancer Screening Referral: no  Additional Screening:  Hepatitis C Screening: does not qualify; Completed yes  Vision Screening: Recommended annual ophthalmology exams for early detection of glaucoma and other disorders of the eye. Is the patient up to date with their annual eye exam?  Yes  Who is the provider or what is the name of the office in which the patient attends annual eye exams? [facility] If pt is not established with a provider, would they like to be referred to a provider to establish care? No .   Dental Screening: Recommended annual dental exams for proper oral hygiene  Community Resource Referral / Chronic Care Management: CRR required this visit?  No   CCM required this visit?  No      Plan:     I have personally reviewed and noted the following in the patient's chart:   Medical and social history Use of alcohol, tobacco or illicit drugs  Current medications and supplements including opioid prescriptions. Patient is  not currently taking opioid prescriptions. Functional ability and status Nutritional status Physical activity Advanced directives List of other physicians Hospitalizations, surgeries, and ER visits in previous 12 months Vitals Screenings to include cognitive, depression, and falls Referrals and appointments  In addition, I have reviewed and discussed with patient certain preventive protocols, quality metrics, and best practice recommendations. A written personalized care plan for preventive services as well as general preventive health recommendations were provided to patient.     Sue Lush, LPN   08/13/1094   Nurse Notes: Pt states he is doing well other than some intermittent dizziness that he will discuss at physical. Pt relays he has a hx of vertigo. He relays it is mild and can wait until next appt/physical.

## 2022-12-12 NOTE — Progress Notes (Unsigned)
      Established patient visit   Patient: Andrew Villanueva   DOB: March 30, 1951   72 y.o. Male  MRN: [id number] Visit Date: 12/13/2022  Today's healthcare provider: Mila Merry, MD   No chief complaint on file.  Subjective    HPI  Patient is a 72 year old male who presents for evaluation of fatigue and dizziness.   Medications: Outpatient Medications Prior to Visit  Medication Sig   amLODipine (NORVASC) 5 MG tablet TAKE 1 TABLET BY MOUTH DAILY   benazepril (LOTENSIN) 10 MG tablet TAKE 1 TABLET BY MOUTH EVERY DAY   cholestyramine (QUESTRAN) 4 GM/DOSE powder Take 1 packet (4 g total) by mouth 2 (two) times daily as needed. (Patient not taking: Reported on 11/14/2022)   No facility-administered medications prior to visit.    Review of Systems  {Labs  Heme  Chem  Endocrine  Serology  Results Review (optional):23779}   Objective    There were no vitals taken for this visit. {Show previous vital signs (optional):23777}  Physical Exam  ***  No results found for any visits on 12/13/22.  Assessment & Plan     ***  No follow-ups on file.      {provider attestation***:1}   Mila Merry, MD  [facility] [phone] (phone) [phone] (fax)  [facility]

## 2022-12-13 ENCOUNTER — Ambulatory Visit (INDEPENDENT_AMBULATORY_CARE_PROVIDER_SITE_OTHER): Payer: Medicare HMO | Admitting: Family Medicine

## 2022-12-13 VITALS — BP 143/80 | HR 62 | Temp 97.6°F | Wt 229.0 lb

## 2022-12-13 DIAGNOSIS — G459 Transient cerebral ischemic attack, unspecified: Secondary | ICD-10-CM

## 2022-12-13 DIAGNOSIS — R42 Dizziness and giddiness: Secondary | ICD-10-CM | POA: Diagnosis not present

## 2022-12-13 DIAGNOSIS — E538 Deficiency of other specified B group vitamins: Secondary | ICD-10-CM | POA: Diagnosis not present

## 2022-12-13 MED ORDER — MECLIZINE HCL 25 MG PO TABS
25.0000 mg | ORAL_TABLET | Freq: Three times a day (TID) | ORAL | 1 refills | Status: AC | PRN
Start: 2022-12-13 — End: ?

## 2022-12-13 NOTE — Patient Instructions (Signed)
.   Please review the attached list of medications and notify my office if there are any errors.   . Please bring all of your medications to every appointment so we can make sure that our medication list is the same as yours.   

## 2022-12-14 LAB — COMPREHENSIVE METABOLIC PANEL
ALT: 42 IU/L (ref 0–44)
AST: 25 IU/L (ref 0–40)
Albumin/Globulin Ratio: 1.6 (ref 1.2–2.2)
Albumin: 4.4 g/dL (ref 3.8–4.8)
Alkaline Phosphatase: 93 IU/L (ref 44–121)
BUN/Creatinine Ratio: 14 (ref 10–24)
BUN: 15 mg/dL (ref 8–27)
Bilirubin Total: 1.4 mg/dL — ABNORMAL HIGH (ref 0.0–1.2)
CO2: 21 mmol/L (ref 20–29)
Calcium: 9.9 mg/dL (ref 8.6–10.2)
Chloride: 102 mmol/L (ref 96–106)
Creatinine, Ser: 1.06 mg/dL (ref 0.76–1.27)
Globulin, Total: 2.8 g/dL (ref 1.5–4.5)
Glucose: 104 mg/dL — ABNORMAL HIGH (ref 70–99)
Potassium: 5.3 mmol/L — ABNORMAL HIGH (ref 3.5–5.2)
Sodium: 141 mmol/L (ref 134–144)
Total Protein: 7.2 g/dL (ref 6.0–8.5)
eGFR: 75 mL/min/{1.73_m2} (ref >59)

## 2022-12-14 LAB — CBC
Hematocrit: 48.3 % (ref 37.5–51.0)
Hemoglobin: 16.1 g/dL (ref 13.0–17.7)
MCH: 33.2 pg — ABNORMAL HIGH (ref 26.6–33.0)
MCHC: 33.3 g/dL (ref 31.5–35.7)
MCV: 100 fL — ABNORMAL HIGH (ref 79–97)
Platelets: 232 10*3/uL (ref 150–450)
RBC: 4.85 x10E6/uL (ref 4.14–5.80)
RDW: 12.5 % (ref 11.6–15.4)
WBC: 6.2 10*3/uL (ref 3.4–10.8)

## 2022-12-14 LAB — VITAMIN B12: Vitamin B-12: 395 pg/mL (ref 232–1245)

## 2022-12-20 ENCOUNTER — Ambulatory Visit
Admission: RE | Admit: 2022-12-20 | Discharge: 2022-12-20 | Disposition: A | Discharge status: Home / Self Care | Payer: Medicare HMO | Source: Ambulatory Visit | Attending: Family Medicine | Admitting: Family Medicine

## 2022-12-20 DIAGNOSIS — G459 Transient cerebral ischemic attack, unspecified: Secondary | ICD-10-CM | POA: Diagnosis present

## 2022-12-20 DIAGNOSIS — R42 Dizziness and giddiness: Secondary | ICD-10-CM | POA: Insufficient documentation

## 2022-12-23 ENCOUNTER — Other Ambulatory Visit: Payer: Self-pay | Admitting: Family Medicine

## 2022-12-23 ENCOUNTER — Telehealth: Payer: Self-pay | Admitting: Family Medicine

## 2022-12-23 NOTE — Telephone Encounter (Signed)
Patient wants results of Ultrasound done 12/20/22 and needs refills on Amlodipine sent to [facility] in [place].

## 2022-12-23 NOTE — Telephone Encounter (Signed)
Unable to refill per protocol, Rx request is too soon. Last refill 12/17/21 for 90 and 4 refills.  Requested Prescriptions  Pending Prescriptions Disp Refills   amLODipine (NORVASC) 5 MG tablet [Pharmacy Med Name: AMLODIPINE BESYLATE 5 MG TAB] 90 tablet 4    Sig: TAKE 1 TABLET BY MOUTH EVERY DAY     Cardiovascular: Calcium Channel Blockers 2 Failed - 12/23/2022  2:34 AM      Failed - Last BP in normal range    BP Readings from Last 1 Encounters:  12/13/22 (!) 143/80         Passed - Last Heart Rate in normal range    Pulse Readings from Last 1 Encounters:  12/13/22 62         Passed - Valid encounter within last 6 months    Recent Outpatient Visits           1 week ago Dizziness   [facility] [name], MD   8 months ago Primary hypertension   [facility] [name], MD   9 months ago Primary hypertension   [facility] Caro Laroche, DO   11 months ago Annual physical exam   [facility] [name], MD   1 year ago Primary hypertension   [facility] [name], MD       Future Appointments             In 1 month Fisher, [name], MD [facility]

## 2022-12-23 NOTE — Telephone Encounter (Signed)
Please review

## 2022-12-26 MED ORDER — AMLODIPINE BESYLATE 5 MG PO TABS: 5.0000 mg | ORAL_TABLET | Freq: Every day | ORAL | 4 refills | Status: DC

## 2022-12-26 NOTE — Addendum Note (Signed)
Addended by: [name] on: 12/26/2022 07:51 AM   Modules accepted: Orders

## 2023-01-23 NOTE — Progress Notes (Unsigned)
I,Sulibeya S Dimas,acting as a scribe for Mila Merry, MD.,have documented all relevant documentation on the behalf of Mila Merry, MD,as directed by  Mila Merry, MD    Complete physical exam   Patient: Andrew Villanueva   DOB: 1951/06/06   72 y.o. Male  MRN: [id number] Visit Date: 01/24/2023  Today's healthcare provider: Mila Merry, MD   Chief Complaint  Patient presents with   Annual Exam   Knee Pain   Dizziness   Subjective    Andrew Villanueva is a 72 y.o. male who presents today for a complete physical exam.  He reports consuming a general diet. The patient does not participate in regular exercise at present. He generally feels well. He reports sleeping well. He does have additional problems to discuss today.   Discussed the use of AI scribe software for clinical note transcription with the patient, who gave verbal consent to proceed.  History of Present Illness   The patient presents for a routine physical. He reports a two-week history of right knee pain, which is exacerbated by sitting and standing. The pain radiates to the toe and is associated with a cramping sensation in the back of the knee. He denies any injury or popping sensation in the knee. The patient also reports ongoing dizziness, which has remained unchanged since his last visit. He denies any associated symptoms such as ringing in the ears or trouble with his throat. He did have carotid ultrasound done recently remarkable for tortuous arteries.   The patient's recent blood work ordered at [facility] oncology, including a complete blood count and metabolic panel, were within normal limits. A follow up ultrasound of the thyroid was also unremarkable. He has not been taking cholestyramine, a medication previously prescribed for loose stools, and reports that his bowel habits are dependent on his diet. He has been managing his blood pressure with amlodipine and Lotensin which he tolerates well with out side effects.        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   Referred [facility] endocrinology 04/2020   Wears dentures    upper partial   Past Surgical History:  Procedure Laterality Date   CHOLECYSTECTOMY N/A 05/30/2018   Procedure: LAPAROSCOPIC CHOLECYSTECTOMY;  Surgeon: Ancil Linsey, MD;  Location: [facility] ORS;  Service: General;  Laterality: N/A;   COLONOSCOPY     COLONOSCOPY WITH PROPOFOL N/A 01/23/2017   Procedure: COLONOSCOPY WITH PROPOFOL;  Surgeon: Midge Minium, MD;  Location: [facility];  Service: Endoscopy;  Laterality: N/A;   HERNIA REPAIR  09/2019   LAPAROSCOPIC PARTIAL COLECTOMY  12/20/2018   laparascopy, surgical, colectomy, partial with removal of terminal ileum with ileocolostomy. [facility] Dr. Milbert Coulter   POLYPECTOMY  01/23/2017   Procedure: POLYPECTOMY;  Surgeon: Midge Minium, MD;  Location: [facility];  Service: Endoscopy;;   Social History   Socioeconomic History   Marital status: Married    Spouse name: Not on file   Number of children: 2   Years of education: Not on  file   Highest education level: Not on file  Occupational History   Occupation: Technical sales engineer  Tobacco Use   Smoking status: Never   Smokeless tobacco: Never  Vaping Use   Vaping Use: Never used  Substance and Sexual Activity   Alcohol use: Yes    Alcohol/week: 2.0 standard drinks of alcohol    Types: 2 Cans of beer per week    Comment: minimal alcohol consumption   Drug use: No   Sexual activity: Not on file  Other Topics  Concern   Not on file  Social History Narrative   Not on file   Social Determinants of Health   Financial Resource Strain: Low Risk  (11/14/2022)   Overall Financial Resource Strain (CARDIA)    Difficulty of Paying Living Expenses: Not hard at all  Food Insecurity: No Food Insecurity (11/14/2022)   Hunger Vital Sign    Worried About Running Out of Food in the Last Year: Never true    Ran Out of Food in the Last Year: Never true  Transportation Needs: No Transportation Needs (11/14/2022)   PRAPARE - Administrator, Civil Service (Medical): No    Lack of Transportation (Non-Medical): No  Physical Activity: Insufficiently Active (11/14/2022)   Exercise Vital Sign    Days of Exercise per Week: 2 days    Minutes of Exercise per Session: 20 min  Stress: No Stress Concern Present (11/14/2022)   [employer] of Occupational Health - Occupational Stress Questionnaire    Feeling of Stress : Not at all  Social Connections: Moderately Isolated (11/14/2022)   Social Connection and Isolation Panel [NHANES]    Frequency of Communication with Friends and Family: More than three times a week    Frequency of Social Gatherings with Friends and Family: More than three times a week    Attends Religious Services: Never    Database administrator or Organizations: No    Attends Banker Meetings: Never    Marital Status: Married  Catering manager Violence: Not At Risk (11/08/2021)   Humiliation, Afraid, Rape, and Kick questionnaire    Fear of Current or Ex-Partner: No    Emotionally Abused: No    Physically Abused: No    Sexually Abused: No   Family Status  Relation Name Status   Mother  Deceased at age 23   Father  Deceased at age 53       natural causes   Brother  Alive   Daughter  Alive   Son  Alive   Brother  Deceased   Other Half Sister Deceased   Neg Hx  (Not Specified)   Family History  Problem Relation Age of Onset   Diabetes Mother    Heart attack Mother    CAD  Father    Diabetes Father    Lung cancer Brother    Colon cancer Neg Hx    Prostate cancer Neg Hx    No Known Allergies  Patient Care Team: [name], MD as PCP - General (Family Medicine) Domenic Schwab, Lucianne Lei, MD as Referring Physician (Dermatology) Rolanda Lundborg, MD as Referring Physician (Hematology and Oncology)   Medications: Outpatient Medications Prior to Visit  Medication Sig   amLODipine (NORVASC) 5 MG tablet Take 1 tablet (5 mg total) by mouth daily.   benazepril (LOTENSIN) 10 MG tablet TAKE 1 TABLET BY MOUTH EVERY DAY   meclizine (ANTIVERT) 25 MG tablet Take 1 tablet (25 mg total) by mouth 3 (  three) times daily as needed for dizziness.   [DISCONTINUED] cholestyramine (QUESTRAN) 4 GM/DOSE powder Take 1 packet (4 g total) by mouth 2 (two) times daily as needed. (Patient not taking: Reported on 01/24/2023)   No facility-administered medications prior to visit.    Review of Systems  Musculoskeletal:  Positive for arthralgias.       Right knee pain  Neurological:  Positive for dizziness.  All other systems reviewed and are negative.     Objective    BP 125/71 (BP Location: Left Arm, Patient Position: Sitting, Cuff Size: Large)   Pulse (!) 53   Temp 98 F (36.7 C) (Temporal)   Resp 12   Wt 233 lb (105.7 kg)   SpO2 96%   BMI 30.74 kg/m     Physical Exam    General Appearance:    Mildly obese male. Alert, cooperative, in no acute distress, appears stated age  Head:    Normocephalic, without obvious abnormality, atraumatic  Eyes:    PERRL, conjunctiva/corneas clear, EOM's intact, fundi    benign, both eyes       Ears:    Normal TM's and external ear canals, both ears  Nose:   Nares normal, septum midline, mucosa normal, no drainage   or sinus tenderness  Throat:   Lips, mucosa, and tongue normal; teeth and gums normal  Neck:   Supple, symmetrical, trachea midline, no adenopathy;       thyroid:  No enlargement/tenderness/nodules; no carotid    bruit or JVD  Back:     Symmetric, no curvature, ROM normal, no CVA tenderness  Lungs:     Clear to auscultation bilaterally, respirations unlabored  Chest wall:    No tenderness or deformity  Heart:    Bradycardic. Normal rhythm. No murmurs, rubs, or gallops.  S1 and S2 normal  Abdomen:     Soft, non-tender, bowel sounds active all four quadrants,    no masses, no organomegaly  Genitalia:    deferred  Rectal:    deferred  Extremities:   All extremities are intact. No cyanosis or edema. Slightly tender along right MCL and LCL  Pulses:   2+ and symmetric all extremities  Skin:   Skin color, texture, turgor normal, no rashes or lesions  Lymph nodes:   Cervical, supraclavicular, and axillary nodes normal  Neurologic:   CNII-XII intact. Normal strength, sensation and reflexes      throughout     Last depression screening scores    11/14/2022   10:57 AM 04/06/2022    9:28 AM 03/02/2022    9:22 AM  PHQ 2/9 Scores  PHQ - 2 Score 0 0 0  PHQ- 9 Score  0 0   Last fall risk screening    11/14/2022   10:56 AM  Fall Risk   Falls in the past year? 0  Number falls in past yr: 0  Injury with Fall? 0  Risk for fall due to : No Fall Risks  Follow up Education provided;Falls prevention discussed   Last Audit-C alcohol use screening    11/14/2022   10:57 AM  Alcohol Use Disorder Test (AUDIT)  1. How often do you have a drink containing alcohol? 0  2. How many drinks containing alcohol do you have on a typical day when you are drinking? 0  3. How often do you have six or more drinks on one occasion? 0  AUDIT-C Score 0   A score of 3 or more in women, and 4  or more in men indicates increased risk for alcohol abuse, EXCEPT if all of the points are from question 1   No results found for any visits on 01/24/23.  Assessment & Plan    Routine Health Maintenance and Physical Exam  Exercise Activities and Dietary recommendations  Goals      DIET - EAT MORE FRUITS AND VEGETABLES         Immunization History  Administered Date(s) Administered   Fluad Quad(high Dose 65+) 05/31/2019, 06/02/2021, 06/24/2022   Influenza,inj,Quad PF,6+ Mos 07/09/2015, 06/15/2018, 06/11/2020   Moderna Sars-Covid-2 Vaccination 09/03/2019, 10/01/2019   Pneumococcal Conjugate-13 11/21/2016   Pneumococcal Polysaccharide-23 12/04/2017   Tdap 10/25/2006, 04/01/2015   Zoster, Live 11/07/2015    Health Maintenance  Topic Date Due   Zoster Vaccines- Shingrix (1 of 2) Never done   COVID-19 Vaccine (3 - Moderna risk series) 10/29/2019   INFLUENZA VACCINE  03/09/2023   Colonoscopy  12/17/2023   DTaP/Tdap/Td (3 - Td or Tdap) 03/31/2025   Pneumonia Vaccine 49+ Years old  Completed   Hepatitis C Screening  Completed   HPV VACCINES  Aged Out    Discussed health benefits of physical activity, and encouraged him to engage in regular exercise appropriate for his age and condition.   2. Prostate cancer screening  - PSA Total (Reflex To Free) ([facility] only)  3. Prescription for Shingrix. Vaccine not administered in office.   - Zoster Vaccine Adjuvanted [facility]) injection; Inject 0.5 mLs into the muscle once for 1 dose.  Dispense: 0.5 mL; Refill: 0  4. Tortuous artery (HCC) Unclear is related to persistent episodes of dizziness or possibly TIA - Ambulatory referral to Vascular Surgery  5. Dizziness  - Ambulatory referral to Vascular Surgery  6. Primary hypertension Well controlled.  Continue current medications.   - Lipid panel  7. B12 deficiency Normal b12 at recent [facility] labs  8. Thyroid nodule Normal follow up ultrasound at [facility]   9. Strain of right knee, initial encounter  2 week onset. Recommend continue wearing knee brace and try OTC diclofenac 1% gel QID   The entirety of the information documented in the History of Present Illness, Review of Systems and Physical Exam were personally obtained by me. Portions of this information were initially documented by the CMA and  reviewed by me for thoroughness and accuracy.     Mila Merry, MD  [facility] [phone] (phone) [phone] (fax)  [facility]

## 2023-01-24 ENCOUNTER — Ambulatory Visit (INDEPENDENT_AMBULATORY_CARE_PROVIDER_SITE_OTHER): Payer: Medicare HMO | Admitting: Family Medicine

## 2023-01-24 ENCOUNTER — Encounter: Payer: Self-pay | Admitting: Family Medicine

## 2023-01-24 VITALS — BP 125/71 | HR 53 | Temp 98.0°F | Resp 12 | Ht 73.0 in | Wt 233.0 lb

## 2023-01-24 DIAGNOSIS — R42 Dizziness and giddiness: Secondary | ICD-10-CM

## 2023-01-24 DIAGNOSIS — Z289 Immunization not carried out for unspecified reason: Secondary | ICD-10-CM

## 2023-01-24 DIAGNOSIS — S86911A Strain of unspecified muscle(s) and tendon(s) at lower leg level, right leg, initial encounter: Secondary | ICD-10-CM

## 2023-01-24 DIAGNOSIS — Z0000 Encounter for general adult medical examination without abnormal findings: Secondary | ICD-10-CM | POA: Diagnosis not present

## 2023-01-24 DIAGNOSIS — I771 Stricture of artery: Secondary | ICD-10-CM

## 2023-01-24 DIAGNOSIS — Z125 Encounter for screening for malignant neoplasm of prostate: Secondary | ICD-10-CM

## 2023-01-24 DIAGNOSIS — E041 Nontoxic single thyroid nodule: Secondary | ICD-10-CM | POA: Diagnosis not present

## 2023-01-24 DIAGNOSIS — I10 Essential (primary) hypertension: Secondary | ICD-10-CM

## 2023-01-24 MED ORDER — SHINGRIX 50 MCG/0.5ML IM SUSR: 0.5000 mL | Freq: Once | INTRAMUSCULAR | 0 refills | Status: AC

## 2023-01-24 NOTE — Patient Instructions (Addendum)
The CDC recommends two doses of Shingrix (the shingles vaccine) separated by 2 to 6 months for adults age 72 years and older. I recommend checking with your insurance plan regarding coverage for this vaccine.   I recommend using OTC diclofenac 1% gel (aka Voltaren gel) on your painful knee up to 4 times a day until it is healed.

## 2023-01-25 LAB — LIPID PANEL
Chol/HDL Ratio: 2.4 ratio (ref 0.0–5.0)
Cholesterol, Total: 142 mg/dL (ref 100–199)
HDL: 58 mg/dL (ref >39)
LDL Chol Calc (NIH): 69 mg/dL (ref 0–99)
Triglycerides: 80 mg/dL (ref 0–149)
VLDL Cholesterol Cal: 15 mg/dL (ref 5–40)

## 2023-01-25 LAB — PSA TOTAL (REFLEX TO FREE): Prostate Specific Ag, Serum: 0.8 ng/mL (ref 0.0–4.0)

## 2023-02-24 ENCOUNTER — Encounter: Payer: Medicare HMO | Admitting: Family Medicine

## 2023-03-22 ENCOUNTER — Other Ambulatory Visit (INDEPENDENT_AMBULATORY_CARE_PROVIDER_SITE_OTHER): Payer: Self-pay | Admitting: Vascular Surgery

## 2023-03-22 DIAGNOSIS — I771 Stricture of artery: Secondary | ICD-10-CM

## 2023-03-22 DIAGNOSIS — R55 Syncope and collapse: Secondary | ICD-10-CM | POA: Insufficient documentation

## 2023-03-22 DIAGNOSIS — R42 Dizziness and giddiness: Secondary | ICD-10-CM

## 2023-03-22 NOTE — Progress Notes (Signed)
MRN : [id number]  Andrew Villanueva is a 72 y.o. (Dec 14, 1950) male who presents with chief complaint of check carotid arteries.  History of Present Illness:   The patient is seen for evaluation of carotid stenosis.  Duplex ultrasound of the carotid artery was obtained Dec 20, 2022 tortuosity was noted but no evidence of hemodynamically significant stenosis was identified.  His primary complaint was dizziness.  The patient denies amaurosis fugax. There is no recent history of TIA symptoms or focal motor deficits. There is no prior documented CVA.  There is no history of migraine headaches. There is no history of seizures.  The patient is taking enteric-coated aspirin 81 mg daily.  No recent shortening of the patient's walking distance or new symptoms consistent with claudication.  No history of rest pain symptoms. No new ulcers or wounds of the lower extremities have occurred.  There is no history of DVT, PE or superficial thrombophlebitis. No recent episodes of angina or shortness of breath documented.   No outpatient medications have been marked as taking for the 03/23/23 encounter (Appointment) with Gilda Crease, Latina Craver, MD.    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   Referred [facility] endocrinology 04/2020   Wears dentures    upper partial    Past Surgical History:  Procedure Laterality Date   CHOLECYSTECTOMY N/A 05/30/2018   Procedure: LAPAROSCOPIC CHOLECYSTECTOMY;  Surgeon: Ancil Linsey,  MD;  Location: [facility] ORS;  Service: General;  Laterality: N/A;   COLONOSCOPY     COLONOSCOPY WITH PROPOFOL N/A 01/23/2017   Procedure: COLONOSCOPY WITH PROPOFOL;  Surgeon: Midge Minium, MD;  Location: [facility];  Service: Endoscopy;  Laterality: N/A;   HERNIA REPAIR  09/2019   LAPAROSCOPIC PARTIAL COLECTOMY  12/20/2018   laparascopy, surgical, colectomy, partial with removal of terminal ileum with ileocolostomy. [facility] Dr. Milbert Coulter   POLYPECTOMY  01/23/2017   Procedure: POLYPECTOMY;  Surgeon: Midge Minium, MD;  Location: [facility];  Service: Endoscopy;;    Social History Social History   Tobacco Use   Smoking status: Never   Smokeless tobacco: Never  Vaping Use   Vaping status: Never Used  Substance Use Topics   Alcohol use: Yes    Alcohol/week: 2.0 standard drinks of alcohol    Types: 2 Cans of beer per week    Comment: minimal alcohol consumption   Drug use: No    Family History Family History  Problem Relation Age of Onset   Diabetes Mother    Heart attack Mother    CAD Father    Diabetes Father    Lung cancer Brother    Colon cancer Neg Hx    Prostate cancer Neg Hx     No Known Allergies   REVIEW OF SYSTEMS (Negative unless checked)  Constitutional: [] Weight loss  [] Fever  [] Chills Cardiac: [] Chest pain   [] Chest pressure   [] Palpitations   []   Shortness of breath when laying flat   [] Shortness of breath with exertion. Vascular:  [x] Pain in legs with walking   [] Pain in legs at rest  [] History of DVT   [] Phlebitis   [] Swelling in legs   [] Varicose veins   [] Non-healing ulcers Pulmonary:   [] Uses home oxygen   [] Productive cough   [] Hemoptysis   [] Wheeze  [] COPD   [] Asthma Neurologic:  [] Dizziness   [] Seizures   [] History of stroke   [] History of TIA  [] Aphasia   [] Vissual changes   [] Weakness or numbness in arm   [] Weakness or numbness in leg Musculoskeletal:   [] Joint swelling   [] Joint pain   [] Low back pain Hematologic:  [] Easy bruising  [] Easy  bleeding   [] Hypercoagulable state   [] Anemic Gastrointestinal:  [] Diarrhea   [] Vomiting  [] Gastroesophageal reflux/heartburn   [] Difficulty swallowing. Genitourinary:  [] Chronic kidney disease   [] Difficult urination  [] Frequent urination   [] Blood in urine Skin:  [] Rashes   [] Ulcers  Psychological:  [] History of anxiety   []  History of major depression.  Physical Examination  There were no vitals filed for this visit. There is no height or weight on file to calculate BMI. Gen: WD/WN, NAD Head: [place]/AT, No temporalis wasting.  Ear/Nose/Throat: Hearing grossly intact, nares w/o erythema or drainage Eyes: PER, EOMI, sclera nonicteric.  Neck: Supple, no masses.  No bruit or JVD.  Pulmonary:  Good air movement, no audible wheezing, no use of accessory muscles.  Cardiac: RRR, normal S1, S2, no Murmurs. Vascular:  carotid bruit noted Vessel Right Left  Radial Palpable Palpable  Carotid  Palpable  Palpable  Gastrointestinal: soft, non-distended. No guarding/no peritoneal signs.  Musculoskeletal: M/S 5/5 throughout.  No visible deformity.  Neurologic: CN 2-12 intact. Pain and light touch intact in extremities.  Symmetrical.  Speech is fluent. Motor exam as listed above. Psychiatric: Judgment intact, Mood & affect appropriate for pt's clinical situation. Dermatologic: No rashes or ulcers noted.  No changes consistent with cellulitis.   CBC Lab Results  Component Value Date   WBC 6.2 12/13/2022   HGB 16.1 12/13/2022   HCT 48.3 12/13/2022   MCV 100 (H) 12/13/2022   PLT 232 12/13/2022    BMET    Component Value Date/Time   NA 141 12/13/2022 1023   K 5.3 (H) 12/13/2022 1023   CL 102 12/13/2022 1023   CO2 21 12/13/2022 1023   GLUCOSE 104 (H) 12/13/2022 1023   GLUCOSE 168 (H) 05/07/2018 1506   BUN 15 12/13/2022 1023   CREATININE 1.06 12/13/2022 1023   CALCIUM 9.9 12/13/2022 1023   GFRNONAA 94 09/21/2018 1244   GFRAA 109 09/21/2018 1244   CrCl cannot be calculated (Patient's most  recent lab result is older than the maximum 21 days allowed.).  COAG No results found for: "INR", "PROTIME"  Radiology No results found.   Assessment/Plan 1. Syncope, unspecified syncope type Recommend:  Given the patient's asymptomatic subcritical stenosis no further invasive testing or surgery at this time.  Duplex ultrasound shows <10% stenosis bilaterally which has been unchanged when compared to the previous studies.  With respect to his dizziness and syncope I recommended cardiac evaluation for bradycardia arrhythmia as well as ENT evaluation.  He will consider this and discuss with his primary care.  Continue antiplatelet therapy as prescribed Continue management of CAD, HTN and Hyperlipidemia Healthy heart diet,  encouraged exercise at least 4 times per week  Given the stable <10% bilateral carotid stenosis in association with the patient's age  the patient will follow up PRN.  The patient is told that if symptoms of a TIA should occur then he should go to the ER and I should be notified, as this would change the management course.  The patient voices understanding.  2. Primary hypertension Continue antihypertensive medications as already ordered, these medications have been reviewed and there are no changes at this time.    Levora Dredge, MD  03/22/2023 2:13 PM

## 2023-03-23 ENCOUNTER — Ambulatory Visit (INDEPENDENT_AMBULATORY_CARE_PROVIDER_SITE_OTHER): Payer: Medicare HMO | Admitting: Vascular Surgery

## 2023-03-23 ENCOUNTER — Encounter (INDEPENDENT_AMBULATORY_CARE_PROVIDER_SITE_OTHER): Payer: Self-pay

## 2023-03-23 ENCOUNTER — Ambulatory Visit (INDEPENDENT_AMBULATORY_CARE_PROVIDER_SITE_OTHER): Payer: Medicare Other

## 2023-03-23 VITALS — BP 132/80 | HR 69 | Resp 19 | Ht 73.2 in | Wt 234.0 lb

## 2023-03-23 DIAGNOSIS — I10 Essential (primary) hypertension: Secondary | ICD-10-CM

## 2023-03-23 DIAGNOSIS — R55 Syncope and collapse: Secondary | ICD-10-CM

## 2023-05-03 ENCOUNTER — Ambulatory Visit (INDEPENDENT_AMBULATORY_CARE_PROVIDER_SITE_OTHER): Payer: Medicare HMO | Admitting: Physician Assistant

## 2023-05-03 ENCOUNTER — Ambulatory Visit: Payer: Self-pay | Admitting: *Deleted

## 2023-05-03 ENCOUNTER — Encounter: Payer: Self-pay | Admitting: Physician Assistant

## 2023-05-03 VITALS — BP 130/77 | HR 69 | Ht 73.0 in | Wt 234.4 lb

## 2023-05-03 DIAGNOSIS — R21 Rash and other nonspecific skin eruption: Secondary | ICD-10-CM | POA: Diagnosis not present

## 2023-05-03 MED ORDER — VALACYCLOVIR HCL 1 G PO TABS
1000.0000 mg | ORAL_TABLET | Freq: Two times a day (BID) | ORAL | 0 refills | Status: AC
Start: 2023-05-03 — End: 2023-05-13

## 2023-05-03 NOTE — Telephone Encounter (Signed)
  Chief Complaint: blister type rash Symptoms: no pain,no itching reported, 3 areas of rash- 2 on back, 1 abdomen Frequency: noticed last night Pertinent Negatives: Patient denies fever, other symptoms Disposition: [] ED /[] Urgent Care (no appt availability in office) / [x] Appointment(In office/virtual)/ []  [place] [facility]/ [] Home Care/ [] Refused Recommended Disposition /[] [facility]/ []  Follow-up with PCP Additional Notes: Patient has been scheduled for this afternoon- care instructions given

## 2023-05-03 NOTE — Telephone Encounter (Signed)
Reason for Disposition . [1] Shingles rash (matches SYMPTOMS) AND [2] onset < 72 hours ago (3 days)  Answer Assessment - Initial Assessment Questions 1. APPEARANCE of RASH: "Describe the rash."      Small circles on the back/blisters on abdomen 2. LOCATION: "Where is the rash located?"      2 patches on back ,one patch on abdomen  3. NUMBER: "How many spots are there?"      3 areas of concern 4. SIZE: "How big are the spots?" (Inches, centimeters or compare to size of a coin)      Abdomen- size of fifty cent piece- area- bump is smaller than eraser 5. ONSET: "When did the rash start?"      Noticed last night on back 6. ITCHING: "Does the rash itch?" If Yes, ask: "How bad is the itch?"  (Scale 0-10; or none, mild, moderate, severe)     no 7. PAIN: "Does the rash hurt?" If Yes, ask: "How bad is the pain?"  (Scale 0-10; or none, mild, moderate, severe)    - NONE (0): no pain    - MILD (1-3): doesn't interfere with normal activities     - MODERATE (4-7): interferes with normal activities or awakens from sleep     - SEVERE (8-10): excruciating pain, unable to do any normal activities     no 8. OTHER SYMPTOMS: "Do you have any other symptoms?" (e.g., fever)     no  Answer Assessment - Initial Assessment Questions 1. APPEARANCE of RASH: "Describe the rash."      Red areas with bumps- blister type 2. LOCATION: "Where is the rash located?"      2 patches on back, 1 patch on abdomen 3. ONSET: "When did the rash start?"      Last night- noticed rash on back 4. ITCHING: "Does the rash itch?" If Yes, ask: "How bad is the itch?"  (Scale 1-10; or mild, moderate, severe)     none 5. PAIN: "Does the rash hurt?" If Yes, ask: "How bad is the pain?"  (Scale 0-10; or none, mild, moderate, severe)    - NONE (0): no pain    - MILD (1-3): doesn't interfere with normal activities     - MODERATE (4-7): interferes with normal activities or awakens from sleep     - SEVERE (8-10): excruciating pain, unable  to do any normal activities     none 6. OTHER SYMPTOMS: "Do you have any other symptoms?" (e.g., fever)     none  Protocols used: Rash or Redness - Localized-A-AH, Shingles (Zoster)-A-AH

## 2023-05-04 NOTE — Progress Notes (Signed)
Established patient visit  Patient: Andrew Villanueva   DOB: [birth year]   72 y.o. Male  MRN: [id number] Visit Date: 05/03/2023  Today's healthcare provider: Debera Lat, PA-C   Chief Complaint  Patient presents with   Rash    Noticed late last night. Located in right abdominal area and on right side of the back. Patient reports they aren't blistered yet. Just redness and irritation with no mild pain on the back     Subjective     Discussed the use of AI scribe software for clinical note transcription with the patient, who gave verbal consent to proceed.  History of Present Illness   The patient, with a history of cold sores, presents with a new rash on the right side of his back. The rash, first noticed the previous night, is not itchy but has caused some soreness. The patient denies any recent exposure to potential allergens such as laundry detergent, perfume, or body wash. He also reports a sensation of something biting his leg the previous night, but there is no visible mark. The patient has had a shingles vaccine in the past, but not the most recent version. He has never had shingles before.         01/24/2023    8:55 AM 11/14/2022   10:57 AM 04/06/2022    9:28 AM  Depression screen PHQ 2/9  Decreased Interest 0 0 0  Down, Depressed, Hopeless 0 0 0  PHQ - 2 Score 0 0 0  Altered sleeping 0  0  Tired, decreased energy 0  0  Change in appetite 0  0  Feeling bad or failure about yourself  0  0  Trouble concentrating 0  0  Moving slowly or fidgety/restless 0  0  Suicidal thoughts 0  0  PHQ-9 Score 0  0  Difficult doing work/chores Not difficult at all  Not difficult at all       No data to display          Medications: Outpatient Medications Prior to Visit  Medication Sig   amLODipine (NORVASC) 5 MG tablet Take 1 tablet (5 mg total) by mouth daily.   benazepril (LOTENSIN) 10 MG tablet TAKE 1 TABLET BY MOUTH EVERY DAY   meclizine (ANTIVERT) 25 MG tablet Take 1 tablet  (25 mg total) by mouth 3 (three) times daily as needed for dizziness.   vitamin B-12 (CYANOCOBALAMIN) 100 MCG tablet Take 100 mcg by mouth daily.   No facility-administered medications prior to visit.    Review of Systems Except see HPI       Objective    BP 130/77 (BP Location: Right Arm, Patient Position: Sitting, Cuff Size: Normal)   Pulse 69   Ht 6\' 1"  (1.854 m)   Wt 234 lb 6.4 oz (106.3 kg)   SpO2 100%   BMI 30.93 kg/m     Physical Exam Constitutional:      General: He is not in acute distress.    Appearance: Normal appearance. He is not diaphoretic.  HENT:     Head: Normocephalic.  Eyes:     Conjunctiva/sclera: Conjunctivae normal.  Pulmonary:     Effort: Pulmonary effort is normal. No respiratory distress.  Skin:    Findings: Rash (abdomen/right side) present.  Neurological:     Mental Status: He is alert and oriented to person, place, and time. Mental status is at baseline.     Photos attached. No results found for any visits on 05/03/23.  Assessment &  Plan       Rash Suspected Herpes Zoster (Shingles) New onset rash on the right side of the back with some tingling sensation. No itching or burning. No history of shingles but has had cold sores before. Received the old shingles vaccine but not the new one. -Photographs taken and added to the patient's chart for further review. -Prescribe antiviral medication (Valacyclovir 1000mg  three times daily for seven days) to [facility] filled at [facility]. Patient advised to start medication if symptoms worsen or spread.  Follow-up Patient advised to report any problems or worsening of symptoms.     No follow-ups on file.     The patient was advised to call back or seek an in-person evaluation if the symptoms worsen or if the condition fails to improve as anticipated.  I discussed the assessment and treatment plan with the patient. The patient was provided an opportunity to ask questions and all were answered. The patient  agreed with the plan and demonstrated an understanding of the instructions.  I, Debera Lat, PA-C have reviewed all documentation for this visit. The documentation on  05/03/23  for the exam, diagnosis, procedures, and orders are all accurate and complete.  Debera Lat, [facility], MMS [facility] [phone] (phone) [phone] (fax)  [facility]

## 2023-05-24 ENCOUNTER — Ambulatory Visit (INDEPENDENT_AMBULATORY_CARE_PROVIDER_SITE_OTHER): Payer: Medicare HMO

## 2023-05-24 DIAGNOSIS — Z23 Encounter for immunization: Secondary | ICD-10-CM

## 2023-05-31 ENCOUNTER — Ambulatory Visit: Payer: Medicare HMO

## 2023-10-13 ENCOUNTER — Encounter: Payer: Self-pay | Admitting: Family Medicine

## 2023-10-13 ENCOUNTER — Ambulatory Visit: Admitting: Family Medicine

## 2023-10-13 VITALS — BP 125/83 | HR 78 | Ht 76.0 in | Wt 231.4 lb

## 2023-10-13 DIAGNOSIS — J301 Allergic rhinitis due to pollen: Secondary | ICD-10-CM

## 2023-10-13 DIAGNOSIS — H1013 Acute atopic conjunctivitis, bilateral: Secondary | ICD-10-CM | POA: Diagnosis not present

## 2023-10-13 DIAGNOSIS — R6889 Other general symptoms and signs: Secondary | ICD-10-CM

## 2023-10-13 LAB — POC COVID19/FLU A&B COMBO
Covid Antigen, POC: NEGATIVE
Influenza A Antigen, POC: NEGATIVE
Influenza B Antigen, POC: NEGATIVE

## 2023-10-13 MED ORDER — BENZONATATE 100 MG PO CAPS: 100.0000 mg | ORAL_CAPSULE | Freq: Two times a day (BID) | ORAL | 0 refills | Status: DC | PRN

## 2023-10-13 MED ORDER — CETIRIZINE HCL 10 MG PO TABS
10.0000 mg | ORAL_TABLET | Freq: Every day | ORAL | 0 refills | Status: DC
Start: 2023-10-13 — End: 2023-11-06

## 2023-10-13 MED ORDER — FLUTICASONE PROPIONATE 50 MCG/ACT NA SUSP: 2.0000 | Freq: Every day | NASAL | 0 refills | Status: DC

## 2023-10-13 MED ORDER — OLOPATADINE HCL 0.2 % OP SOLN: 1.0000 [drp] | Freq: Every day | OPHTHALMIC | 0 refills | Status: DC

## 2023-10-13 NOTE — Progress Notes (Signed)
 Acute visit   Patient: Andrew Villanueva   DOB: 03-09-1951   73 y.o. Male  MRN: [id number] PCP: [name], MD   Chief Complaint  Patient presents with   Nasal Congestion    Runny nose associate with headache, dry cough and sore throat and no fever X 4 days. Pt reports he has taking allergy pills from otc. Pt report mucus is clear.    Subjective    Discussed the use of AI scribe software for clinical note transcription with the patient, who gave verbal consent to proceed.  History of Present Illness   Bandon, a patient with no significant past medical history, presents with a four-day history of watery eyes, runny nose, throat drainage causing soreness, and cough. The symptoms started on Tuesday evening and have been progressively worsening. He denies any shortness of breath or fever. He has not been around anyone who has been sick. He has been outdoors a lot, but not in the past week. However, he did help clean an area with a lot of dust over the weekend. He has no history of allergies.        Review of Systems  Objective    BP 125/83   Pulse 78   Ht 6\' 4"  (1.93 m)   Wt 231 lb 6.4 oz (105 kg)   SpO2 98%   BMI 28.17 kg/m  Physical Exam Constitutional:      General: He is not in acute distress.    Appearance: Normal appearance. He is not diaphoretic.  HENT:     Head: Normocephalic and atraumatic.     Right Ear: Tympanic membrane, ear canal and external ear normal.     Left Ear: Tympanic membrane, ear canal and external ear normal.     Nose: Rhinorrhea present.     Mouth/Throat:     Mouth: Mucous membranes are moist.     Pharynx: Oropharynx is clear. No oropharyngeal exudate.  Eyes:     Extraocular Movements: Extraocular movements intact.     Conjunctiva/sclera:     Right eye: Right conjunctiva is injected.     Left eye: Left conjunctiva is injected.     Pupils: Pupils are equal, round, and reactive to light.  Cardiovascular:     Rate and Rhythm: Normal rate  and regular rhythm.     Heart sounds: Normal heart sounds.  Pulmonary:     Effort: Pulmonary effort is normal. No respiratory distress.     Breath sounds: Normal breath sounds.  Neurological:     Mental Status: He is alert and oriented to person, place, and time. Mental status is at baseline.        Results for orders placed or performed in visit on 10/13/23  POC Covid19/Flu A&B Antigen  Result Value Ref Range   Influenza A Antigen, POC Negative Negative   Influenza B Antigen, POC Negative Negative   Covid Antigen, POC Negative Negative    Assessment & Plan     Problem List Items Addressed This Visit   None Visit Diagnoses       Flu-like symptoms    -  Primary   Relevant Orders   POC Covid19/Flu A&B Antigen (Completed)     Seasonal allergic rhinitis due to pollen         Allergic conjunctivitis of both eyes           Assessment and Plan    Allergic Rhinitis/Conjunctivitis Acute onset of allergic rhinitis symptoms (watery  eyes, rhinorrhea, throat drainage, sore throat, cough) likely triggered by dust exposure. Symptoms began four days ago. No history of allergies. Negative for flu and COVID. Physical exam reveals irritated eyes and nasal congestion. Differential diagnosis includes viral upper respiratory infection, but clinical presentation and lack of fever or dyspnea support allergic etiology. Discussed antihistamines (Zyrtec, Claritin, Allegra), Flonase nasal spray, eye drops, and Tessalon for cough relief. Emphasized consistent medication use for symptom improvement. - Prescribe Zyrtec - Prescribe Flonase nasal spray - Prescribe Pataday eye drops (one drop in each eye once daily) - Prescribe Tessalon for cough (up to twice daily if needed) - [facility] [place] - Advise consistent medication use for the next week or two - Advise to monitor symptoms and report any changes         Meds ordered this encounter  Medications   cetirizine (ZYRTEC) 10  MG tablet    Sig: Take 1 tablet (10 mg total) by mouth daily.    Dispense:  30 tablet    Refill:  0   fluticasone (FLONASE) 50 MCG/ACT nasal spray    Sig: Place 2 sprays into both nostrils daily.    Dispense:  16 g    Refill:  0   Olopatadine HCl 0.2 % SOLN    Sig: Apply 1 drop to eye daily.    Dispense:  2.5 mL    Refill:  0   benzonatate (TESSALON) 100 MG capsule    Sig: Take 1 capsule (100 mg total) by mouth 2 (two) times daily as needed for cough.    Dispense:  20 capsule    Refill:  0     Return if symptoms worsen or fail to improve.      Shirlee Latch, MD  [facility] [phone] (phone) [phone] (fax)  [facility]

## 2023-11-01 ENCOUNTER — Other Ambulatory Visit: Payer: Self-pay | Admitting: Family Medicine

## 2023-11-04 ENCOUNTER — Other Ambulatory Visit: Payer: Self-pay | Admitting: Family Medicine

## 2023-12-06 ENCOUNTER — Ambulatory Visit: Payer: Medicare HMO

## 2023-12-06 VITALS — Ht 76.0 in | Wt 225.0 lb

## 2023-12-06 DIAGNOSIS — Z0000 Encounter for general adult medical examination without abnormal findings: Secondary | ICD-10-CM

## 2023-12-06 NOTE — Patient Instructions (Addendum)
 Andrew Villanueva , Thank you for taking time to come for your Medicare Wellness Visit. I appreciate your ongoing commitment to your health goals. Please review the following plan we discussed and let me know if I can assist you in the future.   Referrals/Orders/Follow-Ups/Clinician Recommendations: Keep maintaining your health by keeping your appointments with  Dr. Jeralene Mom and any specialists that you may see.  Call us  if you need anything.  Have a great year!!!!  Recommendations from Nurse Lemond Griffee: Aim for 30 minutes of exercise or brisk walking 5 days per week.  Drink 6-8 glasses of water  each day. Eat 5-6 servings of fresh vegetables and fruits per day. Continue to do brain exercises such as reading, puzzles, games on your phone to help keep the brain sharp and active.   This is a list of the screening recommended for you and due dates:  Health Maintenance  Topic Date Due   Zoster (Shingles) Vaccine (1 of 2) 07/18/1970   COVID-19 Vaccine (3 - Moderna risk series) 10/29/2019   Colon Cancer Screening  12/17/2023   Flu Shot  03/08/2024   Medicare Annual Wellness Visit  12/05/2024   DTaP/Tdap/Td vaccine (3 - Td or Tdap) 03/31/2025   Pneumonia Vaccine  Completed   Hepatitis C Screening  Completed   HPV Vaccine  Aged Out   Meningitis B Vaccine  Aged Out    Advanced directives: (Declined) Advance directive discussed with you today. Even though you declined this today, please call our office should you change your mind, and we can give you the proper paperwork for you to fill out.  Next Medicare Annual Wellness Visit scheduled for next year: Yes, It was nice speaking with you today! Your next Annual Wellness Visit is scheduled for 12/11/2024 at 11:30 a.m. via PHONE VISIT. If you need to reschedule or cancel, please call [phone].

## 2023-12-06 NOTE — Progress Notes (Signed)
 Because this visit was a virtual/telehealth visit,  certain criteria was not obtained, such a blood pressure, CBG if applicable, and timed get up and go. Any medications not marked as "taking" were not mentioned during the medication reconciliation part of the visit. Any vitals not documented were not able to be obtained due to this being a telehealth visit or patient was unable to self-report a recent blood pressure reading due to a lack of equipment at home via telehealth. Vitals that have been documented are verbally provided by the patient.   Subjective:   Andrew Villanueva is a 73 y.o. who presents for a Medicare Wellness preventive visit.  Visit Complete: Virtual I connected with  [name] on 12/06/23 by a audio enabled telemedicine application and verified that I am speaking with the correct person using two identifiers.  Patient Location: Home  Provider Location: Office/Clinic  I discussed the limitations of evaluation and management by telemedicine. The patient expressed understanding and agreed to proceed.  Vital Signs: Because this visit was a virtual/telehealth visit, some criteria may be missing or patient reported. Any vitals not documented were not able to be obtained and vitals that have been documented are patient reported.  VideoDeclined- This patient declined Librarian, academic. Therefore the visit was completed with audio only.  Persons Participating in Visit: Patient.  AWV Questionnaire: No: Patient Medicare AWV questionnaire was not completed prior to this visit.  Cardiac Risk Factors include: advanced age (>68men, >30 women);sedentary lifestyle;family history of premature cardiovascular disease;male gender     Objective:    Today's Vitals   12/06/23 0953  Weight: 225 lb (102.1 kg)  Height: 6\' 4"  (1.93 m)  PainSc: 0-No pain   Body mass index is 27.39 kg/m.     12/06/2023    9:55 AM 11/14/2022   10:59 AM 11/08/2021    2:49 PM  05/30/2018    6:15 AM 05/23/2018    3:39 PM 05/07/2018    3:05 PM 01/23/2017    8:04 AM  Advanced Directives  Does Patient Have a Medical Advance Directive? No No No No No No No  Would patient like information on creating a medical advance directive? No - Patient declined  No - Patient declined No - Patient declined   Yes (MAU/Ambulatory/Procedural Areas - Information given)    Current Medications (verified) Outpatient Encounter Medications as of 12/06/2023  Medication Sig   amLODipine  (NORVASC ) 5 MG tablet Take 1 tablet (5 mg total) by mouth daily.   benazepril  (LOTENSIN ) 10 MG tablet TAKE 1 TABLET BY MOUTH EVERY DAY   cetirizine  (ZYRTEC ) 10 MG tablet TAKE 1 TABLET BY MOUTH EVERY DAY   fluticasone  (FLONASE ) 50 MCG/ACT nasal spray SPRAY 2 SPRAYS INTO EACH NOSTRIL EVERY DAY   meclizine  (ANTIVERT ) 25 MG tablet Take 1 tablet (25 mg total) by mouth 3 (three) times daily as needed for dizziness.   Olopatadine  HCl 0.2 % SOLN Apply 1 drop to eye daily.   vitamin B-12 (CYANOCOBALAMIN) 100 MCG tablet Take 100 mcg by mouth daily.   benzonatate  (TESSALON ) 100 MG capsule Take 1 capsule (100 mg total) by mouth 2 (two) times daily as needed for cough. (Patient not taking: Reported on 12/06/2023)   No facility-administered encounter medications on file as of 12/06/2023.    Allergies (verified) Patient has no known allergies.   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, cecum, proximal ascending colon, s/p hemicolectomy 2020   GERD (gastroesophageal reflux disease)    OCC-TUMS PRN   History of measles    Hyperprolactinemia (HCC) 04/23/2020   Referred [facility] endocrinology 04/2020   Wears dentures     upper partial   Past Surgical History:  Procedure Laterality Date   CHOLECYSTECTOMY N/A 05/30/2018   Procedure: LAPAROSCOPIC CHOLECYSTECTOMY;  Surgeon: Franki Isles, MD;  Location: [facility];  Service: General;  Laterality: N/A;   COLONOSCOPY     COLONOSCOPY WITH PROPOFOL  N/A 01/23/2017   Procedure: COLONOSCOPY WITH PROPOFOL ;  Surgeon: Marnee Sink, MD;  Location: [facility];  Service: Endoscopy;  Laterality: N/A;   HERNIA REPAIR  09/2019   LAPAROSCOPIC PARTIAL COLECTOMY  12/20/2018   laparascopy, surgical, colectomy, partial with removal of terminal ileum with ileocolostomy. [facility] Dr. Ginnie Laine   POLYPECTOMY  01/23/2017   Procedure: POLYPECTOMY;  Surgeon: Marnee Sink, MD;  Location: [facility];  Service: Endoscopy;;   Family History  Problem Relation Age of Onset   Diabetes Mother    Heart attack Mother    CAD Father    Diabetes Father    Lung cancer Brother    Colon cancer Neg Hx    Prostate cancer Neg Hx    Social History   Socioeconomic History   Marital status: Married    Spouse name: Not on file   Number of children: 2   Years of education: Not on file   Highest education level: Not on file  Occupational History   Occupation: Technical sales engineer  Tobacco Use   Smoking status: Never   Smokeless tobacco: Never  Vaping Use   Vaping status: Never Used  Substance and Sexual Activity   Alcohol use: Yes    Alcohol/week: 2.0 standard drinks of alcohol    Types: 2 Cans of beer per week    Comment: minimal alcohol consumption   Drug use: No   Sexual activity: Not on file  Other Topics Concern   Not on file  Social History Narrative   Not on file   Social Drivers of Health   Financial Resource Strain: Low Risk  (12/06/2023)   Overall Financial Resource Strain (CARDIA)    Difficulty of Paying Living Expenses: Not hard at all  Food Insecurity: No Food Insecurity (12/06/2023)   Hunger Vital Sign    Worried About Running Out of Food in the Last Year:  Never true    Ran Out of Food in the Last Year: Never true  Transportation Needs: No Transportation Needs (12/06/2023)   PRAPARE - Administrator, Civil Service (Medical): No    Lack of Transportation (Non-Medical): No  Physical Activity: Insufficiently Active (12/06/2023)   Exercise Vital Sign    Days of Exercise per Week: 2 days    Minutes of Exercise per Session: 20 min  Stress: No Stress Concern Present (12/06/2023)   [employer] of Occupational Health - Occupational Stress Questionnaire    Feeling of Stress : Not at all  Social Connections: Moderately Isolated (12/06/2023)   Social Connection and Isolation Panel [NHANES]    Frequency of Communication with Friends and Family: More than three times a week    Frequency of Social Gatherings with Friends and Family: More than three times a  week    Attends Religious Services: Never    Active Member of Clubs or Organizations: No    Attends Engineer, structural: Never    Marital Status: Married    Tobacco Counseling Counseling given: Not Answered    Clinical Intake:  Pre-visit preparation completed: Yes  Pain : No/denies pain Pain Score: 0-No pain     BMI - recorded: 27.39 Nutritional Status: BMI 25 -29 Overweight Nutritional Risks: None Diabetes: No  No results found for: "HGBA1C"   How often do you need to have someone help you when you read instructions, pamphlets, or other written materials from your doctor or pharmacy?: 1 - Never  Interpreter Needed?: No  Information entered by :: Brax Walen N. Vanilla Heatherington, LPN.   Activities of Daily Living     12/06/2023    9:57 AM 01/24/2023    8:55 AM  In your present state of health, do you have any difficulty performing the following activities:  Hearing? 0 0  Vision? 0 0  Difficulty concentrating or making decisions? 0 0  Walking or climbing stairs? 0 0  Dressing or bathing? 0 0  Doing errands, shopping? 0 0  Preparing Food and eating ? N   Using  the Toilet? N   In the past six months, have you accidently leaked urine? N   Do you have problems with loss of bowel control? N   Managing your Medications? N   Managing your Finances? N   Housekeeping or managing your Housekeeping? N     Patient Care Team: Lamon Pillow, MD as PCP - General (Family Medicine) Bluford Burkitt, Julie Ellyn, MD as Referring Physician (Dermatology) Harvy Linger, MD as Referring Physician (Hematology and Oncology) [facility] [place] as Consulting Physician (Optometry)  Indicate any recent Medical Services you may have received from other than [facility] providers in the past year (date may be approximate).     Assessment:   This is a routine wellness examination for Nayan.  Hearing/Vision screen Hearing Screening - Comments:: Denies hearing difficulties.   Vision Screening - Comments:: Wears rx glasses - up to date with routine eye exams with [facility] in [place]    Goals Addressed             This Visit's Progress    Patient Stated       12/06/2023: Continue to eat healthy and lose some weight.       Depression Screen     12/06/2023    9:56 AM 01/24/2023    8:55 AM 11/14/2022   10:57 AM 04/06/2022    9:28 AM 03/02/2022    9:22 AM 01/21/2022    9:05 AM 11/08/2021    2:47 PM  PHQ 2/9 Scores  PHQ - 2 Score 0 0 0 0 0 0 0  PHQ- 9 Score 0 0  0 0 0     Fall Risk     12/06/2023    9:55 AM 01/24/2023    8:55 AM 11/14/2022   10:56 AM 04/06/2022    9:28 AM 03/02/2022    9:23 AM  Fall Risk   Falls in the past year? 0 0 0 0 0  Number falls in past yr: 0 0 0 0 0  Injury with Fall? 0 0 0 0 0  Risk for fall due to : No Fall Risks No Fall Risks No Fall Risks No Fall Risks No Fall Risks  Follow up Falls prevention discussed;Falls evaluation completed Falls evaluation completed  Education provided;Falls prevention discussed Falls evaluation completed Falls evaluation completed    MEDICARE RISK AT HOME:  Medicare Risk at Home Any  stairs in or around the home?: Yes If so, are there any without handrails?: No Home free of loose throw rugs in walkways, pet beds, electrical cords, etc?: Yes Adequate lighting in your home to reduce risk of falls?: Yes Life alert?: No Use of a cane, walker or w/c?: No Grab bars in the bathroom?: Yes (shower) Shower chair or bench in shower?: No Elevated toilet seat or a handicapped toilet?: Yes  TIMED UP AND GO:  Was the test performed?  No  Cognitive Function: 6CIT completed    12/06/2023    9:57 AM  MMSE - Mini Mental State Exam  Not completed: Unable to complete        12/06/2023    9:57 AM 11/14/2022   11:08 AM 01/20/2021    9:20 AM 01/20/2020    8:27 AM  6CIT Screen  What Year? 0 points 0 points 0 points 0 points  What month? 0 points 0 points 0 points 0 points  What time? 0 points 0 points 0 points 0 points  Count back from 20 0 points 0 points 0 points 0 points  Months in reverse 0 points 0 points 2 points 0 points  Repeat phrase 0 points 0 points 4 points 2 points  Total Score 0 points 0 points 6 points 2 points    Immunizations Immunization History  Administered Date(s) Administered   Fluad Quad(high Dose 65+) 05/31/2019, 06/02/2021, 06/24/2022   Fluad Trivalent(High Dose 65+) 05/24/2023   Influenza,inj,Quad PF,6+ Mos 07/09/2015, 06/15/2018, 06/11/2020   Moderna Sars-Covid-2 Vaccination 09/03/2019, 10/01/2019   Pneumococcal Conjugate-13 11/21/2016   Pneumococcal Polysaccharide-23 12/04/2017   Tdap 10/25/2006, 04/01/2015   Zoster, Live 11/07/2015    Screening Tests Health Maintenance  Topic Date Due   Zoster Vaccines- Shingrix  (1 of 2) 07/18/1970   COVID-19 Vaccine (3 - Moderna risk series) 10/29/2019   Colonoscopy  12/17/2023   INFLUENZA VACCINE  03/08/2024   Medicare Annual Wellness (AWV)  12/05/2024   DTaP/Tdap/Td (3 - Td or Tdap) 03/31/2025   Pneumonia Vaccine 67+ Years old  Completed   Hepatitis C Screening  Completed   HPV VACCINES  Aged Out    Meningococcal B Vaccine  Aged Out    Health Maintenance  Health Maintenance Due  Topic Date Due   Zoster Vaccines- Shingrix  (1 of 2) 07/18/1970   COVID-19 Vaccine (3 - Moderna risk series) 10/29/2019   Colonoscopy  12/17/2023   Health Maintenance Items Addressed: Yes Patient will be due for Colonoscopy 12/17/2023.  Additional Screening:  Vision Screening: Recommended annual ophthalmology exams for early detection of glaucoma and other disorders of the eye.  Dental Screening: Recommended annual dental exams for proper oral hygiene  Community Resource Referral / Chronic Care Management: CRR required this visit?  No   CCM required this visit?  No     Plan:     I have personally reviewed and noted the following in the patient's chart:   Medical and social history Use of alcohol, tobacco or illicit drugs  Current medications and supplements including opioid prescriptions. Patient is not currently taking opioid prescriptions. Functional ability and status Nutritional status Physical activity Advanced directives List of other physicians Hospitalizations, surgeries, and ER visits in previous 12 months Vitals Screenings to include cognitive, depression, and falls Referrals and appointments  In addition, I have reviewed and discussed with patient certain preventive  protocols, quality metrics, and best practice recommendations. A written personalized care plan for preventive services as well as general preventive health recommendations were provided to patient.     Margette Sheldon, LPN   1/61/0960   After Visit Summary: (MyChart) Due to this being a telephonic visit, the after visit summary with patients personalized plan was offered to patient via MyChart   Notes: Please refer to Routing Comments.

## 2023-12-26 ENCOUNTER — Encounter (INDEPENDENT_AMBULATORY_CARE_PROVIDER_SITE_OTHER): Payer: Self-pay

## 2024-01-26 ENCOUNTER — Encounter: Payer: Self-pay | Admitting: Family Medicine

## 2024-02-28 ENCOUNTER — Encounter: Payer: Self-pay | Admitting: Family Medicine

## 2024-02-28 ENCOUNTER — Ambulatory Visit (INDEPENDENT_AMBULATORY_CARE_PROVIDER_SITE_OTHER): Admitting: Family Medicine

## 2024-02-28 VITALS — BP 128/81 | HR 66 | Resp 14 | Wt 231.7 lb

## 2024-02-28 DIAGNOSIS — Z0000 Encounter for general adult medical examination without abnormal findings: Secondary | ICD-10-CM | POA: Diagnosis not present

## 2024-02-28 DIAGNOSIS — K760 Fatty (change of) liver, not elsewhere classified: Secondary | ICD-10-CM

## 2024-02-28 DIAGNOSIS — Z860101 Personal history of adenomatous and serrated colon polyps: Secondary | ICD-10-CM | POA: Insufficient documentation

## 2024-02-28 DIAGNOSIS — I10 Essential (primary) hypertension: Secondary | ICD-10-CM

## 2024-02-28 DIAGNOSIS — Z125 Encounter for screening for malignant neoplasm of prostate: Secondary | ICD-10-CM

## 2024-02-28 DIAGNOSIS — E538 Deficiency of other specified B group vitamins: Secondary | ICD-10-CM

## 2024-02-28 DIAGNOSIS — G629 Polyneuropathy, unspecified: Secondary | ICD-10-CM

## 2024-02-28 DIAGNOSIS — C8333 Diffuse large B-cell lymphoma, intra-abdominal lymph nodes: Secondary | ICD-10-CM

## 2024-02-28 NOTE — Addendum Note (Signed)
 Addended by: GASPER NANCYANN BRAVO on: 02/28/2024 05:03 PM   Modules accepted: Orders

## 2024-02-28 NOTE — Patient Instructions (Addendum)
.   Please review the attached list of medications and notify my office if there are any errors.   . Try OTC alpha-lipoic acid 600 three a day to see if it helps with neuropathy. If it's not helping after a month you can stop taking it.  

## 2024-02-28 NOTE — Progress Notes (Signed)
 Complete physical exam   Patient: Andrew Villanueva   DOB: 02-Mar-1951   73 y.o. Male  MRN: [id number] Visit Date: 02/28/2024  Today's healthcare provider: Nancyann Perry, MD   Chief Complaint  Patient presents with   Annual Exam    Sleeping pattern: 5-6 hour nightly no trouble sleeping Exercise:  Walks at times No concerns   Subjective    Discussed the use of AI scribe software for clinical note transcription with the patient, who gave verbal consent to proceed.  History of Present Illness   Andrew Villanueva is a 73 year old male who presents for an annual physical exam.  He has a history of neuropathy which he reports began approximately ten years ago following an accident involving his leg. Symptoms include stinging and burning in his feet, which have worsened over time. Initially, a couple of toes were affected and thought to be related to arthritis, but after undergoing chemotherapy, symptoms appeared in both feet. He is currently taking a B12 supplement.  He has a history of lymphomaand continues to follow up with his oncologist at [facility], with his last visit on July 7th. He now attends these appointments annually.  Regarding his gastrointestinal history, he had a colonoscopy in 2020 with findings of lymphoma which apparently precluded through visualization of colon. He had a previous history of multiple adenomatous polyps noted in 2018.   He denies any issues with hearing or ringing in his ears. He reports having a vision exam four to five weeks ago.  He is currently taking blood pressure medication but is unsure about the exact timing of when his prescription will run out.        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   Referred [facility] endocrinology 04/2020   Wears dentures    upper partial   Past Surgical History:  Procedure Laterality Date   CHOLECYSTECTOMY N/A 05/30/2018   Procedure: LAPAROSCOPIC CHOLECYSTECTOMY;  Surgeon: Nicholaus Selinda Birmingham, MD;  Location: [facility];  Service: General;  Laterality: N/A;   COLONOSCOPY     COLONOSCOPY WITH PROPOFOL  N/A 01/23/2017   Procedure: COLONOSCOPY WITH PROPOFOL ;  Surgeon: Jinny Carmine, MD;  Location: [facility];  Service: Endoscopy;  Laterality: N/A;   HERNIA REPAIR  09/2019   LAPAROSCOPIC PARTIAL COLECTOMY  12/20/2018   laparascopy, surgical, colectomy, partial with removal of terminal ileum with ileocolostomy. [facility] Dr. Miguel   POLYPECTOMY  01/23/2017   Procedure: POLYPECTOMY;  Surgeon: Jinny Carmine, MD;  Location: [facility];  Service: Endoscopy;;   Social History   Socioeconomic History   Marital status: Married    Spouse name: Not on file   Number of children: 2   Years of education: Not on file   Highest education level: Not on file  Occupational History   Occupation: Technical sales engineer  Tobacco Use   Smoking status: Never   Smokeless tobacco: Never  Vaping Use   Vaping status: Never Used  Substance and Sexual Activity   Alcohol  use: Yes    Alcohol/week: 2.0 standard drinks of alcohol    Types: 2 Cans of beer per week    Comment: minimal alcohol consumption   Drug use: No   Sexual activity: Not on file  Other Topics Concern   Not on file  Social History Narrative   Not on file   Social Drivers of Health   Financial Resource Strain: Low Risk  (12/06/2023)   Overall Financial Resource Strain (CARDIA)    Difficulty of Paying Living Expenses: Not hard at all  Food Insecurity: No Food Insecurity (12/06/2023)    Hunger Vital Sign    Worried About Running Out of Food in the Last Year: Never true    Ran Out of Food in the Last Year: Never true  Transportation Needs: No Transportation Needs (12/06/2023)   PRAPARE - Administrator, Civil Service (Medical): No    Lack of Transportation (Non-Medical): No  Physical Activity: Insufficiently Active (12/06/2023)   Exercise Vital Sign    Days of Exercise per Week: 2 days    Minutes of Exercise per Session: 20 min  Stress: No Stress Concern Present (12/06/2023)   [employer] of Occupational Health - Occupational Stress Questionnaire    Feeling of Stress : Not at all  Social Connections: Moderately Isolated (12/06/2023)   Social Connection and Isolation Panel    Frequency of Communication with Friends and Family: More than three times a week    Frequency of Social Gatherings with Friends and Family: More than three times a week    Attends Religious Services: Never    Database administrator or Organizations: No    Attends Banker Meetings: Never    Marital Status: Married  Catering manager Violence: Not At Risk (12/06/2023)   Humiliation, Afraid, Rape, and Kick questionnaire    Fear of Current or Ex-Partner: No    Emotionally Abused: No    Physically Abused: No    Sexually Abused: No   Family Status  Relation Name Status   Mother  Deceased at age 78   Father  Deceased at age 66       natural causes   Brother  Alive   Daughter  Alive   Son  Alive   Brother  Deceased   Other Half Sister Deceased   Neg Hx  (Not Specified)  No partnership data on file   Family History  Problem Relation Age of Onset   Diabetes Mother    Heart attack Mother    CAD Father    Diabetes Father    Lung cancer Brother    Colon cancer Neg Hx    Prostate cancer Neg Hx    No Known Allergies  Patient Care Team: Gasper Nancyann BRAVO, MD as PCP - General (Family Medicine) Lesleigh, Julie Ellyn, MD as Referring Physician (Dermatology) Claudean Laneta Mclean, MD as Referring Physician (Hematology and Oncology) [facility] as Consulting Physician (Optometry)   Medications: Outpatient Medications Prior to Visit  Medication Sig   amLODipine  (NORVASC ) 5 MG tablet Take 1 tablet (5 mg total) by mouth daily.   benazepril  (LOTENSIN ) 10 MG tablet TAKE 1 TABLET BY MOUTH EVERY DAY   meclizine  (ANTIVERT ) 25 MG tablet Take 1 tablet (25 mg total) by mouth 3 (three) times daily as needed for dizziness.   Olopatadine  HCl 0.2 % SOLN Apply 1 drop to eye daily.   vitamin B-12 (CYANOCOBALAMIN) 100 MCG tablet Take 100 mcg by mouth  daily.   benzonatate  (TESSALON ) 100 MG capsule Take 1 capsule (100 mg total) by mouth 2 (two) times daily as needed for cough. (Patient not taking: Reported on 12/06/2023)   cetirizine  (ZYRTEC ) 10 MG tablet TAKE 1 TABLET BY MOUTH EVERY DAY   fluticasone  (FLONASE ) 50 MCG/ACT nasal spray SPRAY 2 SPRAYS INTO EACH NOSTRIL EVERY DAY   No facility-administered medications prior to visit.    Review of Systems    Objective    BP 128/81 (BP Location: Left Arm, Patient Position: Sitting, Cuff Size: Normal)   Pulse 66   Resp 14   Wt 231 lb 11.2 oz (105.1 kg)   SpO2 96%   BMI 28.20 kg/m    Physical Exam   General Appearance:    Well developed, well nourished male. Alert, cooperative, in no acute distress, appears stated age  Head:    Normocephalic, without obvious abnormality, atraumatic  Eyes:    PERRL, conjunctiva/corneas clear, EOM's intact, fundi    benign, both eyes       Ears:    Normal TM's and external ear canals, both ears  Nose:   Nares normal, septum midline, mucosa normal, no drainage   or sinus tenderness  Throat:   Lips, mucosa, and tongue normal; teeth and gums normal  Neck:   Supple, symmetrical, trachea midline, no adenopathy;       thyroid:  No enlargement/tenderness/nodules; no carotid   bruit or JVD  Back:     Symmetric, no curvature, ROM normal, no CVA tenderness  Lungs:      Clear to auscultation bilaterally, respirations unlabored  Chest wall:    No tenderness or deformity  Heart:    Normal heart rate. Normal rhythm. No murmurs, rubs, or gallops.  S1 and S2 normal  Abdomen:     Soft, non-tender, bowel sounds active all four quadrants,    no masses, no organomegaly  Genitalia:    deferred  Rectal:    deferred  Extremities:   All extremities are intact. No cyanosis or edema  Pulses:   2+ and symmetric all extremities  Skin:   Skin color, texture, turgor normal, no rashes or lesions  Lymph nodes:   Cervical, supraclavicular, and axillary nodes normal  Neurologic:   CNII-XII intact. Normal strength, sensation and reflexes      throughout     Last depression screening scores    12/06/2023    9:56 AM 01/24/2023    8:55 AM 11/14/2022   10:57 AM  PHQ 2/9 Scores  PHQ - 2 Score 0 0 0  PHQ- 9 Score 0 0    Last fall risk screening    12/06/2023    9:55 AM  Fall Risk   Falls in the past year? 0  Number falls in past yr: 0  Injury with Fall? 0  Risk for fall due to : No Fall Risks  Follow up Falls prevention discussed;Falls evaluation completed   Last Audit-C alcohol use screening    12/06/2023    9:58 AM  Alcohol Use Disorder Test (AUDIT)  1. How often do you have a drink containing alcohol? 1  2. How many drinks containing alcohol do you have on a typical day when you are drinking? 0  3. How often do you have six or more drinks on one occasion? 0  AUDIT-C Score 1   A score of 3 or more in women, and 4 or more in men indicates increased risk for alcohol abuse, EXCEPT if all of  the points are from question 1   No results found for any visits on 02/28/24.  Assessment & Plan    Routine Health Maintenance and Physical Exam  Exercise Activities and Dietary recommendations  Goals      DIET - EAT MORE FRUITS AND VEGETABLES     Patient Stated     12/06/2023: Continue to eat healthy and lose some weight.        Immunization History  Administered  Date(s) Administered   Fluad Quad(high Dose 65+) 05/31/2019, 06/02/2021, 06/24/2022   Fluad Trivalent(High Dose 65+) 05/24/2023   Influenza,inj,Quad PF,6+ Mos 07/09/2015, 06/15/2018, 06/11/2020   Moderna Sars-Covid-2 Vaccination 09/03/2019, 10/01/2019   Pneumococcal Conjugate-13 11/21/2016   Pneumococcal Polysaccharide-23 12/04/2017   Tdap 10/25/2006, 04/01/2015   Zoster, Live 11/07/2015    Health Maintenance  Topic Date Due   Zoster Vaccines- Shingrix  (1 of 2) 07/18/1970   COVID-19 Vaccine (3 - Moderna risk series) 10/29/2019   Colonoscopy  12/17/2023   INFLUENZA VACCINE  03/08/2024   Medicare Annual Wellness (AWV)  12/05/2024   DTaP/Tdap/Td (3 - Td or Tdap) 03/31/2025   Pneumococcal Vaccine: 50+ Years  Completed   Hepatitis C Screening  Completed   Hepatitis B Vaccines  Aged Out   HPV VACCINES  Aged Out   Meningococcal B Vaccine  Aged Out    Discussed health benefits of physical activity, and encouraged him to engage in regular exercise appropriate for his age and condition.   2. Primary hypertension Well controlled.  Continue current medications.    3. Hepatic steatosis Normal liver functions on labs done at [facility] in June  4. B12 deficiency May be contributing to neuropathy, is taking b12 supplement.  - Vitamin B12  5. Diffuse large B-cell lymphoma of intra-abdominal lymph nodes (HCC) Followed at [facility] oncology.   6. Prostate cancer screening  - PSA Total (Reflex To Free)  7. Neuropathy On B12 supplements, he is going to try adding alpha-lipoic acid. He is considering evaluation by neurology.   8. History of adenomatous polyp of colon  - Ambulatory referral to Gastroenterology        Nancyann Perry, MD  [facility] [phone] (phone) [phone] (fax)  [facility]

## 2024-02-29 ENCOUNTER — Ambulatory Visit: Payer: Self-pay | Admitting: Family Medicine

## 2024-02-29 LAB — PSA TOTAL (REFLEX TO FREE): Prostate Specific Ag, Serum: 1.4 ng/mL (ref 0.0–4.0)

## 2024-02-29 LAB — VITAMIN B12: Vitamin B-12: 387 pg/mL (ref 232–1245)

## 2024-03-16 ENCOUNTER — Other Ambulatory Visit: Payer: Self-pay | Admitting: Family Medicine

## 2024-05-24 ENCOUNTER — Encounter: Payer: Self-pay | Admitting: Family Medicine

## 2024-05-24 ENCOUNTER — Ambulatory Visit (INDEPENDENT_AMBULATORY_CARE_PROVIDER_SITE_OTHER): Admitting: Family Medicine

## 2024-05-24 VITALS — BP 138/86 | HR 76 | Temp 97.9°F | Resp 16 | Ht 73.0 in | Wt 233.0 lb

## 2024-05-24 DIAGNOSIS — J069 Acute upper respiratory infection, unspecified: Secondary | ICD-10-CM | POA: Diagnosis not present

## 2024-05-24 DIAGNOSIS — R42 Dizziness and giddiness: Secondary | ICD-10-CM

## 2024-05-24 DIAGNOSIS — R052 Subacute cough: Secondary | ICD-10-CM | POA: Diagnosis not present

## 2024-05-24 MED ORDER — BENZONATATE 100 MG PO CAPS: 100.0000 mg | ORAL_CAPSULE | Freq: Two times a day (BID) | ORAL | 0 refills | Status: AC | PRN

## 2024-05-24 MED ORDER — MECLIZINE HCL 25 MG PO TABS: 25.0000 mg | ORAL_TABLET | Freq: Three times a day (TID) | ORAL | 1 refills | Status: AC | PRN

## 2024-05-24 NOTE — Progress Notes (Signed)
 Established patient visit   Patient: Andrew Villanueva   DOB: Oct 30, 1950   73 y.o. Male  MRN: [id number] Visit Date: 05/24/2024  Today's healthcare provider: Nancyann Perry, MD   Chief Complaint  Patient presents with   URI    Symptoms: sore throat, cough, sinus pressure. Patient reports sore throat is better. No SOB, chest tightness, Fever. Frequency: since Sunday Patient took some left over Tessalon     Subjective    Discussed the use of AI scribe software for clinical note transcription with the patient, who gave verbal consent to proceed.  History of Present Illness   Andrew Villanueva is a 73 year old male who presents with symptoms of an upper respiratory infection.  He has been experiencing symptoms consistent with an upper respiratory infection for the past five to six days. Initially, he noticed a sore spot in his throat, which he was able to 'cough up' during the night, leading to temporary relief. Subsequently, he developed a sore throat on the opposite side, which also resolved. His symptoms include runny eyes, coughing, and spitting up clear mucus over the last couple of days. No fever, chills, or sweats.  He reports a slight headache but denies any shortness of breath. He has been using Tessalon , a medication previously prescribed for coughing, and notes that he had a few pills left which he took. He also experienced dizziness a couple of days prior and took medication previously prescribed for dizziness, which provided relief.  He mentions being 'real hoarse' earlier in the week, specifically on Monday, Tuesday, and Wednesday, but notes that his voice has since improved. No ear pain.       Medications: Outpatient Medications Prior to Visit  Medication Sig   amLODipine  (NORVASC ) 5 MG tablet TAKE 1 TABLET (5 MG TOTAL) BY MOUTH DAILY.   benazepril  (LOTENSIN ) 10 MG tablet TAKE 1 TABLET BY MOUTH EVERY DAY   meclizine  (ANTIVERT ) 25 MG tablet Take 1 tablet (25 mg total)  by mouth 3 (three) times daily as needed for dizziness.   Olopatadine  HCl 0.2 % SOLN Apply 1 drop to eye daily.   vitamin B-12 (CYANOCOBALAMIN) 100 MCG tablet Take 100 mcg by mouth daily.   No facility-administered medications prior to visit.   Review of Systems  Constitutional:  Negative for appetite change, chills and fever.  Respiratory:  Negative for chest tightness, shortness of breath and wheezing.   Cardiovascular:  Negative for chest pain and palpitations.  Gastrointestinal:  Negative for abdominal pain, nausea and vomiting.       Objective    BP 138/86 (BP Location: Left Arm, Patient Position: Sitting, Cuff Size: Normal)   Pulse 76   Temp 97.9 F (36.6 C) (Oral)   Resp 16   Ht 6' 1 (1.854 m)   Wt 233 lb (105.7 kg)   SpO2 99%   BMI 30.74 kg/m   Physical Exam   General Appearance:    Overweight male, alert, cooperative, in no acute distress  HENT:   bilateral TM normal without fluid or infection, throat normal without erythema or exudate, sinuses nontender, and nasal mucosa pale and congested  Eyes:    PERRL, conjunctiva/corneas clear, EOM's intact       Lungs:     Clear to auscultation bilaterally, respirations unlabored  Heart:    Normal heart rate. Normal rhythm. No murmurs, rubs, or gallops.    Neurologic:   Awake, alert, oriented x 3. No apparent focal neurological  defect.        Assessment & Plan    1. Viral upper respiratory tract infection (Primary) Expect steady improving the next 4-5 days.  Call if symptoms change or if not rapidly improving.     2. Vertigo refill meclizine  (ANTIVERT ) 25 MG tablet; Take 1 tablet (25 mg total) by mouth 3 (three) times daily as needed for dizziness.  Dispense: 30 tablet; Refill: 1  3. Subacute cough Refill  benzonatate  (TESSALON ) 100 MG capsule; Take 1 capsule (100 mg total) by mouth 2 (two) times daily as needed for cough.  Dispense: 20 capsule; Refill: 0      Nancyann Perry, MD  [facility]  Family Practice [phone] (phone) [phone] (fax)  [facility]

## 2024-05-24 NOTE — Patient Instructions (Signed)
 SABRA  Please review the attached list of medications and notify my office if there are any errors.   . Please bring all of your medications to every appointment so we can make sure that our medication list is the same as yours.

## 2024-05-27 ENCOUNTER — Telehealth: Payer: Self-pay | Admitting: Family Medicine

## 2024-05-27 MED ORDER — HYDROCODONE BIT-HOMATROP MBR 5-1.5 MG/5ML PO SOLN: 5.0000 mL | Freq: Three times a day (TID) | ORAL | 0 refills | Status: DC | PRN

## 2024-05-27 NOTE — Telephone Encounter (Signed)
 Hycodan cough syrup sent to pharmacy per pt request.

## 2024-05-28 ENCOUNTER — Other Ambulatory Visit: Payer: Self-pay

## 2024-05-28 DIAGNOSIS — R052 Subacute cough: Secondary | ICD-10-CM

## 2024-05-28 MED ORDER — HYDROCODONE BIT-HOMATROP MBR 5-1.5 MG/5ML PO SOLN: 5.0000 mL | Freq: Three times a day (TID) | ORAL | 0 refills | Status: AC | PRN

## 2024-05-28 NOTE — Telephone Encounter (Signed)
 Copied from CRM #[id number]. Topic: Clinical - Medication Question >> May 28, 2024 12:06 PM Antwanette L wrote: Reason for CRM: Madeline Gunner, the patient's wife, called to speak with Niels regarding the patient's Hycodan cough syrup. I informed Madeline Gunner that the office is currently closed for lunch and will reopen at 1 PM. She stated she will call back, but please have Niels reach out to [employer] directly when available

## 2024-06-04 ENCOUNTER — Ambulatory Visit (INDEPENDENT_AMBULATORY_CARE_PROVIDER_SITE_OTHER)

## 2024-06-04 DIAGNOSIS — Z23 Encounter for immunization: Secondary | ICD-10-CM | POA: Diagnosis not present

## 2024-12-11 ENCOUNTER — Ambulatory Visit

## 2025-02-28 ENCOUNTER — Encounter: Admitting: Family Medicine
